# Patient Record
Sex: Female | Born: 1980 | Race: White | Hispanic: No | Marital: Married | Smoking: Never smoker
Health system: Southern US, Community
[De-identification: ages and names within clinical notes are randomized; demographics above are authoritative.]

## PROBLEM LIST (undated history)

## (undated) HISTORY — PX: LAPAROSCOPIC SALPINGOOPHERECTOMY: SUR795

---
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
## 1999-03-30 ENCOUNTER — Inpatient Hospital Stay (HOSPITAL_COMMUNITY): Admission: AD | Admit: 1999-03-30 | Discharge: 1999-03-30 | Payer: Self-pay | Admitting: Obstetrics and Gynecology

## 1999-04-08 ENCOUNTER — Inpatient Hospital Stay (HOSPITAL_COMMUNITY): Admission: AD | Admit: 1999-04-08 | Discharge: 1999-04-10 | Payer: Self-pay | Admitting: Obstetrics and Gynecology

## 2000-05-23 ENCOUNTER — Other Ambulatory Visit: Admission: RE | Admit: 2000-05-23 | Discharge: 2000-05-23 | Payer: Self-pay | Admitting: Obstetrics and Gynecology

## 2000-06-25 ENCOUNTER — Inpatient Hospital Stay (HOSPITAL_COMMUNITY): Admission: AD | Admit: 2000-06-25 | Discharge: 2000-06-25 | Payer: Self-pay | Admitting: Obstetrics and Gynecology

## 2000-08-28 ENCOUNTER — Inpatient Hospital Stay (HOSPITAL_COMMUNITY): Admission: AD | Admit: 2000-08-28 | Discharge: 2000-08-28 | Payer: Self-pay | Admitting: Obstetrics and Gynecology

## 2000-11-30 ENCOUNTER — Inpatient Hospital Stay (HOSPITAL_COMMUNITY): Admission: AD | Admit: 2000-11-30 | Discharge: 2000-11-30 | Payer: Self-pay | Admitting: Obstetrics and Gynecology

## 2000-12-07 ENCOUNTER — Inpatient Hospital Stay (HOSPITAL_COMMUNITY): Admission: AD | Admit: 2000-12-07 | Discharge: 2000-12-09 | Payer: Self-pay | Admitting: Obstetrics and Gynecology

## 2001-01-17 ENCOUNTER — Other Ambulatory Visit: Admission: RE | Admit: 2001-01-17 | Discharge: 2001-01-17 | Payer: Self-pay | Admitting: Obstetrics and Gynecology

## 2011-11-17 ENCOUNTER — Emergency Department (HOSPITAL_BASED_OUTPATIENT_CLINIC_OR_DEPARTMENT_OTHER)
Admission: EM | Admit: 2011-11-17 | Discharge: 2011-11-17 | Disposition: A | Discharge status: Home / Self Care | Payer: BC Managed Care – PPO | Attending: Emergency Medicine | Admitting: Emergency Medicine

## 2011-11-17 ENCOUNTER — Emergency Department (INDEPENDENT_AMBULATORY_CARE_PROVIDER_SITE_OTHER): Payer: BC Managed Care – PPO

## 2011-11-17 DIAGNOSIS — R079 Chest pain, unspecified: Secondary | ICD-10-CM | POA: Insufficient documentation

## 2011-11-17 DIAGNOSIS — R05 Cough: Secondary | ICD-10-CM | POA: Insufficient documentation

## 2011-11-17 DIAGNOSIS — R0789 Other chest pain: Secondary | ICD-10-CM

## 2011-11-17 DIAGNOSIS — R071 Chest pain on breathing: Secondary | ICD-10-CM | POA: Insufficient documentation

## 2011-11-17 DIAGNOSIS — R059 Cough, unspecified: Secondary | ICD-10-CM | POA: Insufficient documentation

## 2011-11-17 LAB — CBC
HCT: 44.1 % (ref 36.0–46.0)
Hemoglobin: 14.4 g/dL (ref 12.0–15.0)
MCH: 27.9 pg (ref 26.0–34.0)
MCHC: 32.7 g/dL (ref 30.0–36.0)
MCV: 85.3 fL (ref 78.0–100.0)
Platelets: 314 10*3/uL (ref 150–400)
RBC: 5.17 MIL/uL — ABNORMAL HIGH (ref 3.87–5.11)
RDW: 13.4 % (ref 11.5–15.5)
WBC: 9.4 10*3/uL (ref 4.0–10.5)

## 2011-11-17 LAB — DIFFERENTIAL
Basophils Absolute: 0 10*3/uL (ref 0.0–0.1)
Basophils Relative: 0 % (ref 0–1)
Eosinophils Absolute: 0.2 10*3/uL (ref 0.0–0.7)
Eosinophils Relative: 2 % (ref 0–5)
Lymphocytes Relative: 21 % (ref 12–46)
Lymphs Abs: 2 10*3/uL (ref 0.7–4.0)
Monocytes Absolute: 0.8 10*3/uL (ref 0.1–1.0)
Monocytes Relative: 8 % (ref 3–12)
Neutro Abs: 6.5 10*3/uL (ref 1.7–7.7)
Neutrophils Relative %: 69 % (ref 43–77)

## 2011-11-17 LAB — D-DIMER, QUANTITATIVE: D-Dimer, Quant: 0.42 ug/mL-FEU (ref 0.00–0.48)

## 2011-11-17 MED ORDER — OXYCODONE-ACETAMINOPHEN 5-325 MG PO TABS: 1.0000 | ORAL_TABLET | ORAL | Status: AC | PRN

## 2011-11-17 NOTE — ED Notes (Signed)
Pt reports a 3 week hx of nonproductive cough and states she was coughing last night and left rib area began to hurt.

## 2011-11-17 NOTE — Discharge Instructions (Signed)
Chest Wall Pain Chest wall pain is pain in or around the bones and muscles of your chest. This may occur:   On its own (spontaneously).   After a viral illness such as the flu.   Through injur.   From coughing.   Minor exercise.  It may take up to 6 weeks to get better; longer if you must stay physically active in your work and activities. HOME CARE INSTRUCTIONS   Avoid over-tiring physical activity. Try not to strain or perform activities which cause pain. This would include any activities using chest, belly (abdominal) and side muscles, especially if heavy weights are used.   Use ice on the painful area for 15 to 20 minutes per hour while awake for the first 2 days. Place the ice in a plastic bag and place a towel between the bag of ice and your skin.   Only take over-the-counter or prescription medicines for pain, discomfort, or fever as directed by your caregiver.  SEEK IMMEDIATE MEDICAL CARE IF:   Your pain increases or you are very uncomfortable.   An oral temperature above 102 F (38.9 C)develops.   Your chest pains become worse.   You develop new, unexplained problems (symptoms).   You develop nausea, vomiting, sweating or feel light headed.   You develop a cough which produces phlegm (sputum) or you cough up blood.  MAKE SURE YOU:   Understand these instructions.   Will watch your condition.   Will get help right away if you are not doing well or get worse.  Document Released: 12/04/2005 Document Revised: 06/19/2011 Document Reviewed: 07/22/2008 [facility] Patient Information 2012 [place], [place].Cough, Adult  A cough is a reflex that helps clear your throat and airways. It can help heal the body or may be a reaction to an irritated airway. A cough may only last 2 or 3 weeks (acute) or may last more than 8 weeks (chronic).  CAUSES Acute cough:  Viral or bacterial infections.  Chronic cough:  Infections.   Allergies.   Asthma.   Post-nasal drip.    Smoking.   Heartburn or acid reflux.   Some medicines.   Chronic lung problems (COPD).   Cancer.  SYMPTOMS   Cough.   Fever.   Chest pain.   Increased breathing rate.   High-pitched whistling sound when breathing (wheezing).   Colored mucus that you cough up (sputum).  TREATMENT   A bacterial cough may be treated with antibiotic medicine.   A viral cough must run its course and will not respond to antibiotics.   Your caregiver may recommend other treatments if you have a chronic cough.  HOME CARE INSTRUCTIONS   Only take over-the-counter or prescription medicines for pain, discomfort, or fever as directed by your caregiver. Use cough suppressants only as directed by your caregiver.   Use a cold steam vaporizer or humidifier in your bedroom or home to help loosen secretions.   Sleep in a semi-upright position if your cough is worse at night.   Rest as needed.   Stop smoking if you smoke.  SEEK IMMEDIATE MEDICAL CARE IF:   You have pus in your sputum.   Your cough starts to worsen.   You cannot control your cough with suppressants and are losing sleep.   You begin coughing up blood.   You have difficulty breathing.   You develop pain which is getting worse or is uncontrolled with medicine.   You have a fever.  MAKE SURE YOU:  Understand these instructions.   Will watch your condition.   Will get help right away if you are not doing well or get worse.  Document Released: 06/02/2011 Document Revised: 08/16/2011 Document Reviewed: 06/02/2011 [facility] Patient Information 2012 [place], [place].

## 2011-11-17 NOTE — ED Provider Notes (Signed)
History     CSN: [id number] Arrival date & time: 11/17/2011  7:54 AM   First MD Initiated Contact with Patient 11/17/11 [phone]      Chief Complaint  Patient presents with  . Cough  . Chest Pain    (Consider location/radiation/quality/duration/timing/severity/associated sxs/prior treatment) HPI Patient with cough for 3 weeks. Began with uri symptoms and low grade fever.  Other symptoms resolved but continues with cough.  Tried mucinex without relief.  Two days ago with coughing had sharp pain in left lower rib cage which continues, worse with coughing sharp, but dull all the time under left breast to around under left shoulder blade.  Nonproductive, no dyspnea.  Patient is not smoker.   History reviewed. No pertinent past medical history.  History reviewed. No pertinent past surgical history.  No family history on file.  History  Substance Use Topics  . Smoking status: Never Smoker   . Smokeless tobacco: Never Used  . Alcohol Use: No    OB History    Grav Para Term Preterm Abortions TAB SAB Ect Mult Living                  Review of Systems  All other systems reviewed and are negative.    Allergies  Review of patient's allergies indicates no known allergies.  Home Medications  No current outpatient prescriptions on file.  BP 155/89  Pulse 113  Temp(Src) 98.9 F (37.2 C) (Oral)  Resp 16  Ht 5\' 8"  (1.727 m)  Wt 220 lb (99.791 kg)  BMI 33.45 kg/m2  SpO2 99%  LMP 10/27/2011  Physical Exam  Nursing note and vitals reviewed. Constitutional: She appears well-developed and well-nourished.  HENT:  Head: Normocephalic and atraumatic.  Right Ear: External ear normal.  Left Ear: External ear normal.    ED Course  Procedures (including critical care time)  Labs Reviewed - No data to display No results found.   No diagnosis found.    MDM   Date: 11/17/2011  Rate: 99  Rhythm: normal sinus rhythm  QRS Axis: normal  Intervals: normal  ST/T Wave  abnormalities: nonspecific ST changes  Conduction Disutrbances:none  Narrative Interpretation:   Old EKG Reviewed: none available        Results for orders placed during the hospital encounter of 11/17/11  CBC      Component Value Range   WBC 9.4  4.0 - 10.5 (K/uL)   RBC 5.17 (*) 3.87 - 5.11 (MIL/uL)   Hemoglobin 14.4  12.0 - 15.0 (g/dL)   HCT 56.2  13.0 - 86.5 (%)   MCV 85.3  78.0 - 100.0 (fL)   MCH 27.9  26.0 - 34.0 (pg)   MCHC 32.7  30.0 - 36.0 (g/dL)   RDW 78.4  69.6 - 29.5 (%)   Platelets 314  150 - 400 (K/uL)  DIFFERENTIAL      Component Value Range   Neutrophils Relative 69  43 - 77 (%)   Neutro Abs 6.5  1.7 - 7.7 (K/uL)   Lymphocytes Relative 21  12 - 46 (%)   Lymphs Abs 2.0  0.7 - 4.0 (K/uL)   Monocytes Relative 8  3 - 12 (%)   Monocytes Absolute 0.8  0.1 - 1.0 (K/uL)   Eosinophils Relative 2  0 - 5 (%)   Eosinophils Absolute 0.2  0.0 - 0.7 (K/uL)   Basophils Relative 0  0 - 1 (%)   Basophils Absolute 0.0  0.0 - 0.1 (K/uL)  D-DIMER,  QUANTITATIVE      Component Value Range   D-Dimer, Quant 0.42  0.00 - 0.48 (ug/mL-FEU)   No information on file.  Dg Chest 2 View  11/17/2011  *RADIOLOGY REPORT*  Clinical Data: Cough and chest pain.  Left-sided rib pain from coughing.  CHEST - 2 VIEW  Comparison: None available.  Findings: The heart size is normal.  The lungs are clear.  There is mild leftward curvature of the upper thoracic spine.  The bones are otherwise unremarkable.  IMPRESSION:  1.  No acute cardiopulmonary disease. 2.  Mild curvature of the upper thoracic spine.  Original Report Authenticated By: Jamesetta Orleans. MATTERN, M.D.    Cough without evidence of acute infiltrate or pneumothorax.  Patient with worsened chest wall pain since cough yesterday.  Plan pain medicine to for pain and cough.    Hilario Quarry, MD 11/17/11 [phone]

## 2011-11-21 ENCOUNTER — Emergency Department (HOSPITAL_BASED_OUTPATIENT_CLINIC_OR_DEPARTMENT_OTHER)
Admission: EM | Admit: 2011-11-21 | Discharge: 2011-11-21 | Disposition: A | Discharge status: Home / Self Care | Payer: BC Managed Care – PPO | Attending: Emergency Medicine | Admitting: Emergency Medicine

## 2011-11-21 ENCOUNTER — Encounter (HOSPITAL_BASED_OUTPATIENT_CLINIC_OR_DEPARTMENT_OTHER): Payer: Self-pay

## 2011-11-21 DIAGNOSIS — R091 Pleurisy: Secondary | ICD-10-CM | POA: Insufficient documentation

## 2011-11-21 DIAGNOSIS — M549 Dorsalgia, unspecified: Secondary | ICD-10-CM | POA: Insufficient documentation

## 2011-11-21 DIAGNOSIS — J40 Bronchitis, not specified as acute or chronic: Secondary | ICD-10-CM | POA: Insufficient documentation

## 2011-11-21 MED ORDER — PREDNISONE 10 MG PO TABS
60.0000 mg | ORAL_TABLET | Freq: Once | ORAL | Status: AC
  Administered 2011-11-21: 60 mg via ORAL
  Filled 2011-11-21: qty 1

## 2011-11-21 MED ORDER — BENZONATATE 100 MG PO CAPS: 100.0000 mg | ORAL_CAPSULE | Freq: Three times a day (TID) | ORAL | Status: AC

## 2011-11-21 MED ORDER — PREDNISONE 50 MG PO TABS: 50.0000 mg | ORAL_TABLET | Freq: Every day | ORAL | Status: AC

## 2011-11-21 MED ORDER — OXYCODONE-ACETAMINOPHEN 5-325 MG PO TABS: 1.0000 | ORAL_TABLET | ORAL | Status: AC | PRN

## 2011-11-21 NOTE — ED Notes (Signed)
Pt reports rib and back pain that started 3 weeks ago and worsening.  She was seen last week and given Percocet.  She states Percocet relieved her pain but she only has one more pill.

## 2011-11-21 NOTE — ED Provider Notes (Signed)
History     CSN: [id number] Arrival date & time: 11/21/2011  2:50 PM   First MD Initiated Contact with Patient 11/21/11 1526      Chief Complaint  Patient presents with  . Back Pain  . Cough    (Consider location/radiation/quality/duration/timing/severity/associated sxs/prior treatment) HPI Comments: Patient presents with 3 weeks of cough without fevers.  She's not sure breath.  She notes that she has pain related to her cough that is in her left mid back and radiates around underneath her breast.  This is the same location the pain has been for the last 3 weeks.  Patient has no associated nausea, vomiting, abdominal pain.  She notes she was here a few days ago and did have a chest x-ray performed and was placed on Percocet which does help with her pain.  Patient does not have a primary care physician which is why she comes back to the emergency department today for reevaluation.  She does state that her cough is improving and her symptoms seem to be getting better she just still has persistent pain.  Patient is a 30 y.o. female presenting with back pain and cough. The history is provided by the patient.  Back Pain  This is a recurrent problem. The current episode started more than 1 week ago. The problem occurs daily. The problem has been gradually improving. The pain is associated with no known injury. The pain is moderate. Associated symptoms include chest pain. Pertinent negatives include no fever, no numbness, no weight loss, no headaches, no abdominal pain, no abdominal swelling, no bowel incontinence, no perianal numbness, no bladder incontinence, no dysuria, no pelvic pain, no leg pain, no paresthesias, no paresis, no tingling and no weakness. She has tried analgesics and NSAIDs for the symptoms.  Cough Associated symptoms include chest pain. Pertinent negatives include no chills, no weight loss, no headaches, no shortness of breath and no eye redness.    History reviewed. No  pertinent past medical history.  History reviewed. No pertinent past surgical history.  No family history on file.  History  Substance Use Topics  . Smoking status: Never Smoker   . Smokeless tobacco: Never Used  . Alcohol Use: No    OB History    Grav Para Term Preterm Abortions TAB SAB Ect Mult Living                  Review of Systems  Constitutional: Negative.  Negative for fever, chills and weight loss.  HENT: Negative.   Eyes: Negative.  Negative for discharge and redness.  Respiratory: Positive for cough. Negative for shortness of breath.   Cardiovascular: Positive for chest pain.  Gastrointestinal: Negative for nausea, vomiting, abdominal pain, diarrhea and bowel incontinence.  Genitourinary: Negative.  Negative for bladder incontinence, dysuria, vaginal discharge and pelvic pain.  Musculoskeletal: Positive for back pain.  Skin: Negative.  Negative for color change and rash.  Neurological: Negative.  Negative for tingling, syncope, weakness, numbness, headaches and paresthesias.  Hematological: Negative.  Negative for adenopathy.  Psychiatric/Behavioral: Negative.  Negative for confusion.  All other systems reviewed and are negative.    Allergies  Review of patient's allergies indicates no known allergies.  Home Medications   Current Outpatient Rx  Name Route Sig Dispense Refill  . OXYCODONE-ACETAMINOPHEN 5-325 MG PO TABS Oral Take 1 tablet by mouth every 4 (four) hours as needed for pain. 10 tablet 0    BP 140/96  Pulse 102  Temp(Src) 98.6 F (37 C) (  Oral)  Resp 16  Ht 5\' 8"  (1.727 m)  Wt 220 lb (99.791 kg)  BMI 33.45 kg/m2  SpO2 100%  LMP 10/27/2011  Physical Exam  Constitutional: She is oriented to person, place, and time. She appears well-developed and well-nourished.  Non-toxic appearance. She does not have a sickly appearance.  HENT:  Head: Normocephalic and atraumatic.  Eyes: Conjunctivae, EOM and lids are normal. Pupils are equal, round,  and reactive to light. No scleral icterus.  Neck: Trachea normal and normal range of motion. Neck supple.  Cardiovascular: Normal rate, regular rhythm and normal heart sounds.   Pulmonary/Chest: Effort normal and breath sounds normal. No respiratory distress. She has no wheezes. She has no rales. She exhibits no tenderness.  Abdominal: Soft. Normal appearance. There is no tenderness. There is no rebound, no guarding and no CVA tenderness.  Musculoskeletal: Normal range of motion.  Neurological: She is alert and oriented to person, place, and time. She has normal strength.  Skin: Skin is warm, dry and intact. No rash noted.  Psychiatric: She has a normal mood and affect. Her behavior is normal. Judgment and thought content normal.    ED Course  Procedures (including critical care time)  Labs Reviewed - No data to display No results found.   No diagnosis found.    MDM  Patient with persistent cough and left-sided chest pain worse with cough.  Patient notes it's been going on for the last 3 weeks.  She was seen here 5 days ago and did have a chest x-ray performed which was normal.  Patient is not short of breath. patient appears to have more pleuritic pain without specific pneumonia.  Clinically she still does not have pneumonia and I do not believe needs a repeat chest x-ray today.  We will give her some steroids to see if this calms down the inflammation and also treat her with Jerilynn Som for her cough.  I also counseled her regarding the importance of a primary care physician.        Nat Christen, MD 11/21/11 [phone]

## 2018-10-31 ENCOUNTER — Encounter: Payer: Self-pay | Admitting: Sports Medicine

## 2018-10-31 ENCOUNTER — Ambulatory Visit (INDEPENDENT_AMBULATORY_CARE_PROVIDER_SITE_OTHER): Payer: BC Managed Care – PPO | Admitting: Sports Medicine

## 2018-10-31 DIAGNOSIS — M5431 Sciatica, right side: Secondary | ICD-10-CM

## 2018-10-31 DIAGNOSIS — G43109 Migraine with aura, not intractable, without status migrainosus: Secondary | ICD-10-CM | POA: Diagnosis not present

## 2018-10-31 DIAGNOSIS — M51369 Other intervertebral disc degeneration, lumbar region without mention of lumbar back pain or lower extremity pain: Secondary | ICD-10-CM | POA: Insufficient documentation

## 2018-10-31 DIAGNOSIS — Z0000 Encounter for general adult medical examination without abnormal findings: Secondary | ICD-10-CM | POA: Diagnosis not present

## 2018-10-31 DIAGNOSIS — M5136 Other intervertebral disc degeneration, lumbar region: Secondary | ICD-10-CM | POA: Insufficient documentation

## 2018-10-31 NOTE — Assessment & Plan Note (Addendum)
Large work-up including multiple brain MRIs were normal with the exception of a single T2 hyperintensity which can be seen in migraines. Lumbar puncture with a normal opening pressure and normal tests. She gets episodes of paresthesias in the face sometimes followed by headache. I think she is having migraine equivalents, occasionally acephalgic migraines. On further questioning she gets two debilitating migraines per year, and thus told her neurologist she did not have migraines. She does however have 4-6 headache days per month, unilateral, throbbing. I do not think any further neurologic work-up is needed, we did discuss topiramate for preventive purposes but she is going to simply using ibuprofen as needed.

## 2018-10-31 NOTE — Assessment & Plan Note (Signed)
Declines flu shots. In a year we will do a tetanus shot, she is not sure on when her last Pap smear was. Had recent blood work which was normal.

## 2018-10-31 NOTE — Progress Notes (Signed)
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  Her symptoms consist mostly of occasional fasciculations of her eyelids which I told her were normal.  She also gets occasional migratory paresthesias over her face, arms, sometimes followed by a headache.  She tells me she has no migraines but on more detailed questioning she only has had 2 debilitating migraines in her life but approximately 4-6 headache days per month with mild photophobia, phonophobia, unilateral, throbbing and resolved with an occasional ibuprofen.  I reviewed the past medical history, family history, social history, surgical history, and allergies today and no changes were needed.  Please see the problem list section below in epic for further details.  Past Medical History: No past medical history on file. Past Surgical History: Past Surgical History:  Procedure Laterality Date  . LAPAROSCOPIC SALPINGOOPHERECTOMY     Social History: Social History   Socioeconomic History  . Marital status: Married    Spouse name: Not on file  . Number of children: Not on file  . Years of education: Not on file  . Highest education level: Not on file  Occupational History  . Not on file  Social Needs  . Financial resource strain: Not on file  . Food insecurity:    Worry: Not on file    Inability: Not on file  . Transportation needs:    Medical: Not on file    Non-medical: Not on file  Tobacco Use  . Smoking status: Never Smoker  . Smokeless tobacco: Never Used  Substance and Sexual Activity  . Alcohol use: No  . Drug use: Never  . Sexual activity: Yes  Lifestyle  . Physical activity:    Days per  week: Not on file    Minutes per session: Not on file  . Stress: Not on file  Relationships  . Social connections:    Talks on phone: Not on file    Gets together: Not on file    Attends religious service: Not on file    Active member of club or organization: Not on file    Attends meetings of clubs or organizations: Not on file    Relationship status: Not on file  Other Topics Concern  . Not on file  Social History Narrative  . Not on file   Family History: No family history on file. Allergies: No Known Allergies Medications: See med rec.  Review of Systems: No headache, visual changes, nausea, vomiting, diarrhea, constipation, dizziness, abdominal pain, skin rash, fevers, chills, night sweats, swollen lymph nodes, weight loss, chest pain, body aches, joint swelling, muscle aches, shortness of breath, mood changes, visual or auditory hallucinations.  Objective:    General: Well Developed, well nourished, and in no acute distress.  Neuro: Alert and oriented x3, extra-ocular muscles intact, sensation grossly intact.  Cranial nerves II through XII are intact, motor, sensory, coordinative functions are all intact, no finger dysmetria, no dysdiadochokinesis, negative Romberg sign. HEENT: Normocephalic, atraumatic, pupils equal round reactive to light, neck supple, no masses, no lymphadenopathy, thyroid nonpalpable.  Skin: Warm and dry, no rashes noted.  Cardiac: Regular rate and rhythm, no murmurs rubs or gallops.  Respiratory: Clear to auscultation bilaterally.  Not using accessory muscles, speaking in full sentences.  Abdominal: Soft, nontender, nondistended, positive bowel sounds, no masses, no organomegaly.  Musculoskeletal: Shoulder, elbow, wrist, hip, knee, ankle stable, and with full range of motion.  Impression and Recommendations:    The patient was counselled, risk factors were discussed, anticipatory guidance given.  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 Lumbar puncture with a normal opening pressure and normal tests. She gets episodes of paresthesias in the face sometimes followed by headache. I think she is having migraine equivalents, occasionally acephalgic migraines. On further questioning she gets two debilitating migraines per year, and thus told her neurologist she did not have migraines. She does however have 4-6 headache days per month, unilateral, throbbing. I do not think any further neurologic work-up is needed, we did discuss topiramate for preventive purposes but she is going to simply using ibuprofen as needed.  Sciatica, right side Take an occasional gabapentin and symptoms are gone. No further work-up needed.  Annual physical exam Declines flu shots. In a year we will do a tetanus shot, she is not sure on when her last Pap smear was. Had recent blood work which was normal. ___________________________________________ Gwen Her. Dianah Field, M.D., ABFM., CAQSM. Primary Care and Sports Medicine [facility]  Adjunct Professor of [employer]

## 2018-10-31 NOTE — Assessment & Plan Note (Signed)
Take an occasional gabapentin and symptoms are gone. No further work-up needed.

## 2018-10-31 NOTE — Patient Instructions (Signed)
Migraine Headache A migraine headache is an intense, throbbing pain on one side or both sides of the head. Migraines may also cause other symptoms, such as nausea, vomiting, and sensitivity to light and noise. What are the causes? Doing or taking certain things may also trigger migraines, such as:  Alcohol.  Smoking.  Medicines, such as: ? Medicine used to treat chest pain (nitroglycerine). ? Birth control pills. ? Estrogen pills. ? Certain blood pressure medicines.  Aged cheeses, chocolate, or caffeine.  Foods or drinks that contain nitrates, glutamate, aspartame, or tyramine.  Physical activity.  Other things that may trigger a migraine include:  Menstruation.  Pregnancy.  Hunger.  Stress, lack of sleep, too much sleep, or fatigue.  Weather changes.  What increases the risk? The following factors may make you more likely to experience migraine headaches:  Age. Risk increases with age.  Family history of migraine headaches.  Being Caucasian.  Depression and anxiety.  Obesity.  Being a woman.  Having a hole in the heart (patent foramen ovale) or other heart problems.  What are the signs or symptoms? The main symptom of this condition is pulsating or throbbing pain. Pain may:  Happen in any area of the head, such as on one side or both sides.  Interfere with daily activities.  Get worse with physical activity.  Get worse with exposure to bright lights or loud noises.  Other symptoms may include:  Nausea.  Vomiting.  Dizziness.  General sensitivity to bright lights, loud noises, or smells.  Before you get a migraine, you may get warning signs that a migraine is developing (aura). An aura may include:  Seeing flashing lights or having blind spots.  Seeing bright spots, halos, or zigzag lines.  Having tunnel vision or blurred vision.  Having numbness or a tingling feeling.  Having trouble talking.  Having muscle weakness.  How is this  diagnosed? A migraine headache can be diagnosed based on:  Your symptoms.  A physical exam.  Tests, such as CT scan or MRI of the head. These imaging tests can help rule out other causes of headaches.  Taking fluid from the spine (lumbar puncture) and analyzing it (cerebrospinal fluid analysis, or CSF analysis).  How is this treated? A migraine headache is usually treated with medicines that:  Relieve pain.  Relieve nausea.  Prevent migraines from coming back.  Treatment may also include:  Acupuncture.  Lifestyle changes like avoiding foods that trigger migraines.  Follow these instructions at home: Medicines  Take over-the-counter and prescription medicines only as told by your health care provider.  Do not drive or use heavy machinery while taking prescription pain medicine.  To prevent or treat constipation while you are taking prescription pain medicine, your health care provider may recommend that you: ? Drink enough fluid to keep your urine clear or pale yellow. ? Take over-the-counter or prescription medicines. ? Eat foods that are high in fiber, such as fresh fruits and vegetables, whole grains, and beans. ? Limit foods that are high in fat and processed sugars, such as fried and sweet foods. Lifestyle  Avoid alcohol use.  Do not use any products that contain nicotine or tobacco, such as cigarettes and e-cigarettes. If you need help quitting, ask your health care provider.  Get at least 8 hours of sleep every night.  Limit your stress. General instructions   Keep a journal to find out what may trigger your migraine headaches. For example, write down: ? What you eat and   drink. ? How much sleep you get. ? Any change to your diet or medicines.  If you have a migraine: ? Avoid things that make your symptoms worse, such as bright lights. ? It may help to lie down in a dark, quiet room. ? Do not drive or use heavy machinery. ? Ask your health care provider  what activities are safe for you while you are experiencing symptoms.  Keep all follow-up visits as told by your health care provider. This is important. Contact a health care provider if:  You develop symptoms that are different or more severe than your usual migraine symptoms. Get help right away if:  Your migraine becomes severe.  You have a fever.  You have a stiff neck.  You have vision loss.  Your muscles feel weak or like you cannot control them.  You start to lose your balance often.  You develop trouble walking.  You faint. This information is not intended to replace advice given to you by your health care provider. Make sure you discuss any questions you have with your health care provider. Document Released: 12/04/2005 Document Revised: 06/23/2016 Document Reviewed: 05/22/2016 [employer]vier Interactive Patient Education  2017 [employer]   

## 2018-12-26 ENCOUNTER — Ambulatory Visit (INDEPENDENT_AMBULATORY_CARE_PROVIDER_SITE_OTHER): Payer: BC Managed Care – PPO | Admitting: Sports Medicine

## 2018-12-26 ENCOUNTER — Ambulatory Visit (INDEPENDENT_AMBULATORY_CARE_PROVIDER_SITE_OTHER): Payer: BC Managed Care – PPO

## 2018-12-26 VITALS — BP 133/91 | HR 102 | Ht 68.0 in | Wt 256.0 lb

## 2018-12-26 DIAGNOSIS — M5412 Radiculopathy, cervical region: Secondary | ICD-10-CM

## 2018-12-26 DIAGNOSIS — Z0000 Encounter for general adult medical examination without abnormal findings: Secondary | ICD-10-CM

## 2018-12-26 DIAGNOSIS — M5431 Sciatica, right side: Secondary | ICD-10-CM

## 2018-12-26 DIAGNOSIS — M542 Cervicalgia: Secondary | ICD-10-CM | POA: Diagnosis not present

## 2018-12-26 DIAGNOSIS — E669 Obesity, unspecified: Secondary | ICD-10-CM

## 2018-12-26 DIAGNOSIS — M545 Low back pain: Secondary | ICD-10-CM | POA: Diagnosis not present

## 2018-12-26 DIAGNOSIS — R635 Abnormal weight gain: Secondary | ICD-10-CM | POA: Insufficient documentation

## 2018-12-26 MED ORDER — PHENTERMINE HCL 37.5 MG PO TABS: ORAL_TABLET | ORAL | 0 refills | Status: DC

## 2018-12-26 MED ORDER — POLYETHYLENE GLYCOL 3350 17 G PO PACK: 17.0000 g | PACK | Freq: Two times a day (BID) | ORAL | 11 refills | Status: AC

## 2018-12-26 MED ORDER — PREDNISONE 50 MG PO TABS: ORAL_TABLET | ORAL | 0 refills | Status: DC

## 2018-12-26 NOTE — Progress Notes (Signed)
Subjective:    CC: Annual physical  HPI:  This is a pleasant 38 year old female here for her physical, she does have some complaints.  As for her physical she is due for HIV testing, Pap smear is up-to-date.  Neck pain: Present for many months, axial, worse with flexion, right periscapular radiating pain, left-sided weakness.  No trauma, constitutional symptoms.  Low back pain: With occasional radiculitis, historically well controlled over the past few months with gabapentin.  Now with worsening symptoms in spite of physician directed conservative measures.  Obesity: Would like to start weight loss treatment  I reviewed the past medical history, family history, social history, surgical history, and allergies today and no changes were needed.  Please see the problem list section below in epic for further details.  Past Medical History: No past medical history on file. Past Surgical History: Past Surgical History:  Procedure Laterality Date  . LAPAROSCOPIC SALPINGOOPHERECTOMY     Social History: Social History   Socioeconomic History  . Marital status: Married    Spouse name: Not on file  . Number of children: Not on file  . Years of education: Not on file  . Highest education level: Not on file  Occupational History  . Not on file  Social Needs  . Financial resource strain: Not on file  . Food insecurity:    Worry: Not on file    Inability: Not on file  . Transportation needs:    Medical: Not on file    Non-medical: Not on file  Tobacco Use  . Smoking status: Never Smoker  . Smokeless tobacco: Never Used  Substance and Sexual Activity  . Alcohol use: No  . Drug use: Never  . Sexual activity: Yes  Lifestyle  . Physical activity:    Days per week: Not on file    Minutes per session: Not on file  . Stress: Not on file  Relationships  . Social connections:    Talks on phone: Not on file    Gets together: Not on file    Attends religious service: Not on file     Active member of club or organization: Not on file    Attends meetings of clubs or organizations: Not on file    Relationship status: Not on file  Other Topics Concern  . Not on file  Social History Narrative  . Not on file   Family History: No family history on file. Allergies: No Known Allergies Medications: See med rec.  Review of Systems: No headache, visual changes, nausea, vomiting, diarrhea, constipation, dizziness, abdominal pain, skin rash, fevers, chills, night sweats, swollen lymph nodes, weight loss, chest pain, body aches, joint swelling, muscle aches, shortness of breath, mood changes, visual or auditory hallucinations.  Objective:    General: Well Developed, well nourished, and in no acute distress.  Neuro: Alert and oriented x3, extra-ocular muscles intact, sensation grossly intact. Cranial nerves II through XII are intact, motor, sensory, and coordinative functions are all intact. HEENT: Normocephalic, atraumatic, pupils equal round reactive to light, neck supple, no masses, no lymphadenopathy, thyroid nonpalpable. Oropharynx, nasopharynx, external ear canals are unremarkable. Skin: Warm and dry, no rashes noted.  Cardiac: Regular rate and rhythm, no murmurs rubs or gallops.  Respiratory: Clear to auscultation bilaterally. Not using accessory muscles, speaking in full sentences.  Abdominal: Soft, nontender, nondistended, positive bowel sounds, no masses, no organomegaly.  Neck: Negative spurling's Full neck range of motion Grip strength and sensation normal in bilateral hands Strength good C4  to T1 distribution No sensory change to C4 to T1 Reflexes normal Back Exam:  Inspection: Unremarkable  Motion: Flexion 45 deg, Extension 45 deg, Side Bending to 45 deg bilaterally,  Rotation to 45 deg bilaterally  SLR laying: Negative  XSLR laying: Negative  Palpable tenderness: None. FABER: negative. Sensory change: Gross sensation intact to all lumbar and sacral  dermatomes.  Reflexes: 2+ at both patellar tendons, 2+ at achilles tendons, Babinski's downgoing.  Strength at foot  Plantar-flexion: 5/5 Dorsi-flexion: 5/5 Eversion: 5/5 Inversion: 5/5  Leg strength  Quad: 5/5 Hamstring: 5/5 Hip flexor: 5/5 Hip abductors: 5/5  Gait unremarkable.  Impression and Recommendations:    The patient was counselled, risk factors were discussed, anticipatory guidance given.  Annual physical exam Annual physical as above. Checking routine labs.  Sciatica, right side Chronic low back pain, occasional right-sided sciatica. Historically better with gabapentin but worsening. Present for years now without improvement. X-ray, MRI for interventional planning.  Radiculitis of right cervical region Right periscapular radicular symptoms, left arm progressive weakness. 5 days of prednisone, x-rays, C-spine MRI.   Obesity Starting phentermine, return monthly for weight checks and refills. She does get frequent constipation when she has used this in the past, adding MiraLAX per her request. ___________________________________________ Gwen Her. Dianah Field, M.D., ABFM., CAQSM. Primary Care and Sports Medicine [facility]  Adjunct Professor of [place] [employer] [facility] of Medicine

## 2018-12-26 NOTE — Assessment & Plan Note (Signed)
Annual physical as above. Checking routine labs.  

## 2018-12-26 NOTE — Assessment & Plan Note (Signed)
Chronic low back pain, occasional right-sided sciatica. Historically better with gabapentin but worsening. Present for years now without improvement. X-ray, MRI for interventional planning.

## 2018-12-26 NOTE — Assessment & Plan Note (Signed)
Right periscapular radicular symptoms, left arm progressive weakness. 5 days of prednisone, x-rays, C-spine MRI.

## 2018-12-26 NOTE — Assessment & Plan Note (Addendum)
Starting phentermine, return monthly for weight checks and refills. She does get frequent constipation when she has used this in the past, adding MiraLAX per her request.

## 2018-12-30 ENCOUNTER — Ambulatory Visit (INDEPENDENT_AMBULATORY_CARE_PROVIDER_SITE_OTHER): Payer: BC Managed Care – PPO

## 2018-12-30 DIAGNOSIS — M5431 Sciatica, right side: Secondary | ICD-10-CM

## 2018-12-30 DIAGNOSIS — M5412 Radiculopathy, cervical region: Secondary | ICD-10-CM

## 2018-12-30 DIAGNOSIS — M50222 Other cervical disc displacement at C5-C6 level: Secondary | ICD-10-CM

## 2019-01-01 ENCOUNTER — Encounter: Payer: Self-pay | Admitting: Sports Medicine

## 2019-01-01 ENCOUNTER — Ambulatory Visit (INDEPENDENT_AMBULATORY_CARE_PROVIDER_SITE_OTHER): Payer: BC Managed Care – PPO | Admitting: Sports Medicine

## 2019-01-01 DIAGNOSIS — M5136 Other intervertebral disc degeneration, lumbar region: Secondary | ICD-10-CM | POA: Diagnosis not present

## 2019-01-01 DIAGNOSIS — M5412 Radiculopathy, cervical region: Secondary | ICD-10-CM

## 2019-01-01 DIAGNOSIS — M51369 Other intervertebral disc degeneration, lumbar region without mention of lumbar back pain or lower extremity pain: Secondary | ICD-10-CM

## 2019-01-01 MED ORDER — MELOXICAM 15 MG PO TABS
ORAL_TABLET | ORAL | 3 refills | Status: AC
Start: 2019-01-01 — End: ?

## 2019-01-01 NOTE — Assessment & Plan Note (Signed)
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 As above, adequately controlled with NSAIDs and an occasional gabapentin. Switching to meloxicam for more convenient dosing.

## 2019-01-01 NOTE — Progress Notes (Signed)
Subjective:    CC: Follow-up  HPI: Neck pain: Cervical DDD on MRI.  Overall well controlled with NSAIDs and an occasional gabapentin.  Back pain: Lumbar DDD on MRI, well controlled with NSAIDs and an occasional gabapentin  I reviewed the past medical history, family history, social history, surgical history, and allergies today and no changes were needed.  Please see the problem list section below in epic for further details.  Past Medical History: No past medical history on file. Past Surgical History: Past Surgical History:  Procedure Laterality Date  . LAPAROSCOPIC SALPINGOOPHERECTOMY     Social History: Social History   Socioeconomic History  . Marital status: Married    Spouse name: Not on file  . Number of children: Not on file  . Years of education: Not on file  . Highest education level: Not on file  Occupational History  . Not on file  Social Needs  . Financial resource strain: Not on file  . Food insecurity:    Worry: Not on file    Inability: Not on file  . Transportation needs:    Medical: Not on file    Non-medical: Not on file  Tobacco Use  . Smoking status: Never Smoker  . Smokeless tobacco: Never Used  Substance and Sexual Activity  . Alcohol use: No  . Drug use: Never  . Sexual activity: Yes  Lifestyle  . Physical activity:    Days per week: Not on file    Minutes per session: Not on file  . Stress: Not on file  Relationships  . Social connections:    Talks on phone: Not on file    Gets together: Not on file    Attends religious service: Not on file    Active member of club or organization: Not on file    Attends meetings of clubs or organizations: Not on file    Relationship status: Not on file  Other Topics Concern  . Not on file  Social History Narrative  . Not on file   Family History: No family history on file. Allergies: No Known Allergies Medications: See med rec.  Review of Systems: No fevers, chills, night sweats, weight  loss, chest pain, or shortness of breath.   Objective:    General: Well Developed, well nourished, and in no acute distress.  Neuro: Alert and oriented x3, extra-ocular muscles intact, sensation grossly intact.  HEENT: Normocephalic, atraumatic, pupils equal round reactive to light, neck supple, no masses, no lymphadenopathy, thyroid nonpalpable.  Skin: Warm and dry, no rashes. Cardiac: Regular rate and rhythm, no murmurs rubs or gallops, no lower extremity edema.  Respiratory: Clear to auscultation bilaterally. Not using accessory muscles, speaking in full sentences.  MRI personally reviewed with the patient.  Impression and Recommendations:    Radiculitis of right cervical region MRI shows a C5-C6 disc protrusion. Only mild central canal stenosis and by foraminal stenosis. Symptoms are overall well controlled with occasional gabapentin and ibuprofen. Switching to meloxicam once daily as an NSAID and she can continue to use gabapentin as needed. Not bad enough yet to consider an epidural.  Lumbar degenerative disc disease L3-L4 disc protrusion with right L3 foraminal stenosis. As above, adequately controlled with NSAIDs and an occasional gabapentin. Switching to meloxicam for more convenient dosing.  I spent 25 minutes with this patient, greater than 50% was face-to-face time counseling regarding the above diagnoses, specifically going over images and teaching her the anatomy and showing her the degenerative processes in her neck  and back. ___________________________________________ Gwen Her. Dianah Field, M.D., ABFM., CAQSM. Primary Care and Sports Medicine [facility]  Adjunct Professor of [place] [employer] [facility] of Medicine

## 2019-01-01 NOTE — Assessment & Plan Note (Signed)
MRI shows a C5-C6 disc protrusion. Only mild central canal stenosis and by foraminal stenosis. Symptoms are overall well controlled with occasional gabapentin and ibuprofen. Switching to meloxicam once daily as an NSAID and she can continue to use gabapentin as needed. Not bad enough yet to consider an epidural.

## 2019-01-02 ENCOUNTER — Ambulatory Visit: Payer: BC Managed Care – PPO | Admitting: Sports Medicine

## 2019-01-02 LAB — LIPID PANEL W/REFLEX DIRECT LDL
Cholesterol: 212 mg/dL — ABNORMAL HIGH (ref <200)
HDL: 45 mg/dL — ABNORMAL LOW (ref >50)
LDL Cholesterol (Calc): 141 mg/dL — ABNORMAL HIGH
Non-HDL Cholesterol (Calc): 167 mg/dL (calc) — ABNORMAL HIGH (ref <130)
Total CHOL/HDL Ratio: 4.7 (calc) (ref <5.0)
Triglycerides: 140 mg/dL (ref <150)

## 2019-01-02 LAB — CBC
HCT: 45.5 % — ABNORMAL HIGH (ref 35.0–45.0)
Hemoglobin: 15.3 g/dL (ref 11.7–15.5)
MCH: 28.5 pg (ref 27.0–33.0)
MCHC: 33.6 g/dL (ref 32.0–36.0)
MCV: 84.9 fL (ref 80.0–100.0)
MPV: 9.4 fL (ref 7.5–12.5)
Platelets: 363 10*3/uL (ref 140–400)
RBC: 5.36 Million/uL — ABNORMAL HIGH (ref 3.80–5.10)
RDW: 13.4 % (ref 11.0–15.0)
WBC: 10.6 Thousand/uL (ref 3.8–10.8)

## 2019-01-02 LAB — COMPREHENSIVE METABOLIC PANEL WITH GFR
AG Ratio: 1.4 (calc) (ref 1.0–2.5)
Albumin: 4.5 g/dL (ref 3.6–5.1)
Alkaline phosphatase (APISO): 71 U/L (ref 33–115)
CO2: 24 mmol/L (ref 20–32)
Calcium: 9.4 mg/dL (ref 8.6–10.2)
Globulin: 3.2 g/dL (ref 1.9–3.7)
Potassium: 4.1 mmol/L (ref 3.5–5.3)
Sodium: 138 mmol/L (ref 135–146)
Total Bilirubin: 0.5 mg/dL (ref 0.2–1.2)
Total Protein: 7.7 g/dL (ref 6.1–8.1)

## 2019-01-02 LAB — COMPREHENSIVE METABOLIC PANEL
ALT: 39 U/L — ABNORMAL HIGH (ref 6–29)
AST: 25 U/L (ref 10–30)
BUN: 16 mg/dL (ref 7–25)
Chloride: 104 mmol/L (ref 98–110)
Creat: 1.02 mg/dL (ref 0.50–1.10)
Glucose, Bld: 82 mg/dL (ref 65–99)

## 2019-01-02 LAB — HIV ANTIBODY (ROUTINE TESTING W REFLEX): HIV 1&2 Ab, 4th Generation: NONREACTIVE

## 2019-01-02 LAB — TSH: TSH: 2.6 m[IU]/L

## 2019-01-23 ENCOUNTER — Ambulatory Visit: Payer: BC Managed Care – PPO | Admitting: Sports Medicine

## 2019-02-21 ENCOUNTER — Ambulatory Visit: Payer: BC Managed Care – PPO | Admitting: Sports Medicine

## 2019-02-21 ENCOUNTER — Encounter: Payer: Self-pay | Admitting: Sports Medicine

## 2019-02-21 DIAGNOSIS — E669 Obesity, unspecified: Secondary | ICD-10-CM | POA: Diagnosis not present

## 2019-02-21 MED ORDER — PHENTERMINE HCL 37.5 MG PO TABS: ORAL_TABLET | ORAL | 0 refills | Status: DC

## 2019-02-21 NOTE — Progress Notes (Signed)
  Subjective:    CC: Weight check  HPI: This is a pleasant 38 year old female, she has lost 12 pounds after the first month on phentermine, she did take a couple of months to get in.  She has been having some constipation, initially okay with some MiraLAX but backed off of the dose.  I reviewed the past medical history, family history, social history, surgical history, and allergies today and no changes were needed.  Please see the problem list section below in epic for further details.  Past Medical History: No past medical history on file. Past Surgical History: Past Surgical History:  Procedure Laterality Date  . LAPAROSCOPIC SALPINGOOPHERECTOMY     Social History: Social History   Socioeconomic History  . Marital status: Married    Spouse name: Not on file  . Number of children: Not on file  . Years of education: Not on file  . Highest education level: Not on file  Occupational History  . Not on file  Social Needs  . Financial resource strain: Not on file  . Food insecurity:    Worry: Not on file    Inability: Not on file  . Transportation needs:    Medical: Not on file    Non-medical: Not on file  Tobacco Use  . Smoking status: Never Smoker  . Smokeless tobacco: Never Used  Substance and Sexual Activity  . Alcohol use: No  . Drug use: Never  . Sexual activity: Yes  Lifestyle  . Physical activity:    Days per week: Not on file    Minutes per session: Not on file  . Stress: Not on file  Relationships  . Social connections:    Talks on phone: Not on file    Gets together: Not on file    Attends religious service: Not on file    Active member of club or organization: Not on file    Attends meetings of clubs or organizations: Not on file    Relationship status: Not on file  Other Topics Concern  . Not on file  Social History Narrative  . Not on file   Family History: No family history on file. Allergies: No Known Allergies Medications: See med  rec.  Review of Systems: No fevers, chills, night sweats, weight loss, chest pain, or shortness of breath.   Objective:    General: Well Developed, well nourished, and in no acute distress.  Neuro: Alert and oriented x3, extra-ocular muscles intact, sensation grossly intact.  HEENT: Normocephalic, atraumatic, pupils equal round reactive to light, neck supple, no masses, no lymphadenopathy, thyroid nonpalpable.  Skin: Warm and dry, no rashes. Cardiac: Regular rate and rhythm, no murmurs rubs or gallops, no lower extremity edema.  Respiratory: Clear to auscultation bilaterally. Not using accessory muscles, speaking in full sentences.  Impression and Recommendations:    Obesity Finished 1 month of phentermine, 12 pound weight loss. She needs to ramp up the MiraLAX up to twice a day again. If persistent constipation we can consider the addition of Senokot-S.    ___________________________________________ Gwen Her. Dianah Field, M.D., ABFM., CAQSM. Primary Care and Sports Medicine [facility]  Adjunct Professor of [place] [employer] [employer] [facility] of Medicine

## 2019-02-21 NOTE — Assessment & Plan Note (Signed)
Finished 1 month of phentermine, 12 pound weight loss. She needs to ramp up the MiraLAX up to twice a day again. If persistent constipation we can consider the addition of Senokot-S.

## 2019-03-17 ENCOUNTER — Encounter: Payer: Self-pay | Admitting: Sports Medicine

## 2019-03-17 DIAGNOSIS — F5101 Primary insomnia: Secondary | ICD-10-CM | POA: Insufficient documentation

## 2019-03-17 MED ORDER — TRAZODONE HCL 50 MG PO TABS: 50.0000 mg | ORAL_TABLET | Freq: Every day | ORAL | 1 refills | Status: DC

## 2019-03-17 NOTE — Assessment & Plan Note (Signed)
start with some elements of sleep hygiene such as avoiding a bright screen before bed, avoiding things that make you the bathroom, and avoiding exercise before bedtime.  In addition we could try starting with low-dose trazodone.  Let me know how this works over the next couple of weeks and we can uptitrate the dose if needed.

## 2019-03-25 ENCOUNTER — Ambulatory Visit (INDEPENDENT_AMBULATORY_CARE_PROVIDER_SITE_OTHER): Payer: BC Managed Care – PPO | Admitting: Sports Medicine

## 2019-03-25 ENCOUNTER — Other Ambulatory Visit: Payer: Self-pay

## 2019-03-25 ENCOUNTER — Encounter: Payer: Self-pay | Admitting: Sports Medicine

## 2019-03-25 DIAGNOSIS — F5101 Primary insomnia: Secondary | ICD-10-CM

## 2019-03-25 DIAGNOSIS — E669 Obesity, unspecified: Secondary | ICD-10-CM

## 2019-03-25 MED ORDER — PHENTERMINE HCL 37.5 MG PO TABS: ORAL_TABLET | ORAL | 0 refills | Status: DC

## 2019-03-25 MED ORDER — TRAZODONE HCL 100 MG PO TABS: 100.0000 mg | ORAL_TABLET | Freq: Every day | ORAL | 3 refills | Status: DC

## 2019-03-25 NOTE — Assessment & Plan Note (Signed)
Entering the third month, additional 9 pound weight loss after 12 pounds the first month. Refilling phentermine. She will get a blood pressure cuff so that we can have a full set of vitals in a month. We can do the next visit as a WebEx or telephone visit. No further complaints of constipation.

## 2019-03-25 NOTE — Progress Notes (Signed)
Virtual Visit via Telephone   I connected with  Kim Bradshaw  on 03/25/19 by telephone and verified that I am speaking with the correct person using two identifiers.   I discussed the limitations, risks, security and privacy concerns of performing an evaluation and management service by telephone, including the higher likelihood of inaccurate diagnosis and treatment, and the availability of in person appointments.  We also discussed the likely need of an additional face to face encounter for complete and high quality delivery of care.  I also discussed with the patient that there may be a patient responsible charge related to this service. The patient expressed understanding and wishes to proceed.  Subjective:    CC: Multiple issues  HPI: Obesity: Good continue weight loss after the second month of phentermine.  Insomnia: No response to 50 mg of trazodone, happy to go up on it.  I reviewed the past medical history, family history, social history, surgical history, and allergies today and no changes were needed.  Please see the problem list section below in epic for further details.  Past Medical History: No past medical history on file. Past Surgical History: Past Surgical History:  Procedure Laterality Date  . LAPAROSCOPIC SALPINGOOPHERECTOMY     Social History: Social History   Socioeconomic History  . Marital status: Married    Spouse name: Not on file  . Number of children: Not on file  . Years of education: Not on file  . Highest education level: Not on file  Occupational History  . Not on file  Social Needs  . Financial resource strain: Not on file  . Food insecurity:    Worry: Not on file    Inability: Not on file  . Transportation needs:    Medical: Not on file    Non-medical: Not on file  Tobacco Use  . Smoking status: Never Smoker  . Smokeless tobacco: Never Used  Substance and Sexual Activity  . Alcohol use: No  . Drug use: Never  . Sexual activity:  Yes  Lifestyle  . Physical activity:    Days per week: Not on file    Minutes per session: Not on file  . Stress: Not on file  Relationships  . Social connections:    Talks on phone: Not on file    Gets together: Not on file    Attends religious service: Not on file    Active member of club or organization: Not on file    Attends meetings of clubs or organizations: Not on file    Relationship status: Not on file  Other Topics Concern  . Not on file  Social History Narrative  . Not on file   Family History: No family history on file. Allergies: No Known Allergies Medications: See med rec.  Review of Systems: No fevers, chills, night sweats, weight loss, chest pain, or shortness of breath.   Objective:    General: Speaking full sentences, no audible heavy breathing.  Sounds alert and appropriately interactive.  No other physical exam performed due to the non-face to face nature of this visit.  Impression and Recommendations:    Obesity Entering the third month, additional 9 pound weight loss after 12 pounds the first month. Refilling phentermine. She will get a blood pressure cuff so that we can have a full set of vitals in a month. We can do the next visit as a WebEx or telephone visit. No further complaints of constipation.  Primary insomnia Going up  to 100 mg of trazodone at bedtime, reevaluate in a month.   I discussed the above assessment and treatment plan with the patient. The patient was provided an opportunity to ask questions and all were answered. The patient agreed with the plan and demonstrated an understanding of the instructions.   The patient was advised to call back or seek an in-person evaluation if the symptoms worsen or if the condition fails to improve as anticipated.   I provided 21 minutes of non-face-to-face time during this encounter, less than 50% of this was time needed to gather information, review chart, records, and complete documentation.    ___________________________________________ Gwen Her. Dianah Field, M.D., ABFM., CAQSM. Primary Care and Sports Medicine [facility]  Adjunct Professor of [place] of [facility] of Medicine

## 2019-03-25 NOTE — Assessment & Plan Note (Signed)
Going up to 100 mg of trazodone at bedtime, reevaluate in a month.

## 2019-03-27 ENCOUNTER — Encounter: Payer: Self-pay | Admitting: Sports Medicine

## 2019-03-31 ENCOUNTER — Encounter: Payer: Self-pay | Admitting: Sports Medicine

## 2019-03-31 ENCOUNTER — Ambulatory Visit (INDEPENDENT_AMBULATORY_CARE_PROVIDER_SITE_OTHER): Payer: BC Managed Care – PPO | Admitting: Sports Medicine

## 2019-03-31 DIAGNOSIS — D171 Benign lipomatous neoplasm of skin and subcutaneous tissue of trunk: Secondary | ICD-10-CM | POA: Diagnosis not present

## 2019-03-31 MED ORDER — DIAZEPAM 10 MG PO TABS: ORAL_TABLET | ORAL | 0 refills | Status: DC

## 2019-03-31 NOTE — Assessment & Plan Note (Signed)
Painful, return in a 30-minute slot on Wednesday for surgical excision. 10 mg of Valium to be taken 2 hours before the procedure.

## 2019-03-31 NOTE — Patient Instructions (Signed)
Lipoma    A lipoma is a noncancerous (benign) tumor that is made up of fat cells. This is a very common type of soft-tissue growth. Lipomas are usually found under the skin (subcutaneous). They may occur in any tissue of the body that contains fat. Common areas for lipomas to appear include the back, shoulders, buttocks, and thighs.   Lipomas grow slowly, and they are usually painless. Most lipomas do not cause problems and do not require treatment.  What are the causes?  The cause of this condition is not known.  What increases the risk?  You are more likely to develop this condition if:   You are 40-60 years old.   You have a family history of lipomas.  What are the signs or symptoms?  A lipoma usually appears as a small, round bump under the skin. In most cases, the lump will:   Feel soft or rubbery.   Not cause pain or other symptoms.  However, if a lipoma is located in an area where it pushes on nerves, it can become painful or cause other symptoms.  How is this diagnosed?  A lipoma can usually be diagnosed with a physical exam. You may also have tests to confirm the diagnosis and to rule out other conditions. Tests may include:   Imaging tests, such as a CT scan or MRI.   Removal of a tissue sample to be looked at under a microscope (biopsy).  How is this treated?  Treatment for this condition depends on the size of the lipoma and whether it is causing any symptoms.   For small lipomas that are not causing problems, no treatment is needed.   If a lipoma is bigger or it causes problems, surgery may be done to remove the lipoma. Lipomas can also be removed to improve appearance. Most often, the procedure is done after applying a medicine that numbs the area (local anesthetic).  Follow these instructions at home:   Watch your lipoma for any changes.   Keep all follow-up visits as told by your health care provider. This is important.  Contact a health care provider if:   Your lipoma becomes larger or  hard.   Your lipoma becomes painful, red, or increasingly swollen. These could be signs of infection or a more serious condition.  Get help right away if:   You develop tingling or numbness in an area near the lipoma. This could indicate that the lipoma is causing nerve damage.  Summary   A lipoma is a noncancerous tumor that is made up of fat cells.   Most lipomas do not cause problems and do not require treatment.   If a lipoma is bigger or it causes problems, surgery may be done to remove the lipoma.  This information is not intended to replace advice given to you by your health care provider. Make sure you discuss any questions you have with your health care provider.  Document Released: 11/24/2002 Document Revised: 11/20/2017 Document Reviewed: 11/20/2017  Elsevier Interactive Patient Education  2019 [employer]

## 2019-03-31 NOTE — Progress Notes (Signed)
  Subjective:    CC: Abdominal mass  HPI: Over the past few weeks Kamaryn has noted a tender mass on her right abdominal wall, symptoms are mild, worsening, localized without radiation.  No GI symptoms, no constitutional symptoms.  I reviewed the past medical history, family history, social history, surgical history, and allergies today and no changes were needed.  Please see the problem list section below in epic for further details.  Past Medical History: No past medical history on file. Past Surgical History: Past Surgical History:  Procedure Laterality Date  . LAPAROSCOPIC SALPINGOOPHERECTOMY     Social History: Social History   Socioeconomic History  . Marital status: Married    Spouse name: Not on file  . Number of children: Not on file  . Years of education: Not on file  . Highest education level: Not on file  Occupational History  . Not on file  Social Needs  . Financial resource strain: Not on file  . Food insecurity:    Worry: Not on file    Inability: Not on file  . Transportation needs:    Medical: Not on file    Non-medical: Not on file  Tobacco Use  . Smoking status: Never Smoker  . Smokeless tobacco: Never Used  Substance and Sexual Activity  . Alcohol use: No  . Drug use: Never  . Sexual activity: Yes  Lifestyle  . Physical activity:    Days per week: Not on file    Minutes per session: Not on file  . Stress: Not on file  Relationships  . Social connections:    Talks on phone: Not on file    Gets together: Not on file    Attends religious service: Not on file    Active member of club or organization: Not on file    Attends meetings of clubs or organizations: Not on file    Relationship status: Not on file  Other Topics Concern  . Not on file  Social History Narrative  . Not on file   Family History: No family history on file. Allergies: No Known Allergies Medications: See med rec.  Review of Systems: No fevers, chills, night sweats,  weight loss, chest pain, or shortness of breath.   Objective:    General: Well Developed, well nourished, and in no acute distress.  Neuro: Alert and oriented x3, extra-ocular muscles intact, sensation grossly intact.  HEENT: Normocephalic, atraumatic, pupils equal round reactive to light, neck supple, no masses, no lymphadenopathy, thyroid nonpalpable.  Skin: Warm and dry, no rashes. Cardiac: Regular rate and rhythm, no murmurs rubs or gallops, no lower extremity edema.  Respiratory: Clear to auscultation bilaterally. Not using accessory muscles, speaking in full sentences. Abdomen: Soft, nontender, nondistended, normal bowel sounds, there is a palpable well-defined, soft, 2 to 3 cm mass in the right sided abdominal wall at the anterior axillary line.  This feels like a lipoma.  It is in fact quite tender to palpation.  Impression and Recommendations:    Lipoma of abdominal wall Painful, return in a 30-minute slot on Wednesday for surgical excision. 10 mg of Valium to be taken 2 hours before the procedure.   ___________________________________________ Gwen Her. Dianah Field, M.D., ABFM., CAQSM. Primary Care and Sports Medicine [facility]  Adjunct Professor of [place] of [facility] of [employer]

## 2019-04-02 ENCOUNTER — Ambulatory Visit (INDEPENDENT_AMBULATORY_CARE_PROVIDER_SITE_OTHER): Payer: BC Managed Care – PPO | Admitting: Sports Medicine

## 2019-04-02 ENCOUNTER — Encounter: Payer: Self-pay | Admitting: Sports Medicine

## 2019-04-02 DIAGNOSIS — D171 Benign lipomatous neoplasm of skin and subcutaneous tissue of trunk: Secondary | ICD-10-CM | POA: Diagnosis not present

## 2019-04-02 MED ORDER — HYDROCODONE-ACETAMINOPHEN 5-325 MG PO TABS: 1.0000 | ORAL_TABLET | Freq: Three times a day (TID) | ORAL | 0 refills | Status: DC | PRN

## 2019-04-02 NOTE — Patient Instructions (Signed)
Incision Care, Adult °An incision is a surgical cut that is made through your skin. Most incisions are closed after surgery. Your incision may be closed with stitches (sutures), staples, skin glue, or adhesive strips. You may need to return to your health care provider to have sutures or staples removed. This may occur several days to several weeks after your surgery. The incision needs to be cared for properly to prevent infection. °How to care for your incision °Incision care ° °· Follow instructions from your health care provider about how to take care of your incision. Make sure you: °? Wash your hands with soap and water before you change the bandage (dressing). If soap and water are not available, use hand sanitizer. °? Change your dressing as told by your health care provider. °? Leave sutures, skin glue, or adhesive strips in place. These skin closures may need to stay in place for 2 weeks or longer. If adhesive strip edges start to loosen and curl up, you may trim the loose edges. Do not remove adhesive strips completely unless your health care provider tells you to do that. °· Check your incision area every day for signs of infection. Check for: °? More redness, swelling, or pain. °? More fluid or blood. °? Warmth. °? Pus or a bad smell. °· Ask your health care provider how to clean the incision. This may include: °? Using mild soap and water. °? Using a clean towel to pat the incision dry after cleaning it. °? Applying a cream or ointment. Do this only as told by your health care provider. °? Covering the incision with a clean dressing. °· Ask your health care provider when you can leave the incision uncovered. °· Do not take baths, swim, or use a hot tub until your health care provider approves. Ask your health care provider if you can take showers. You may only be allowed to take sponge baths for bathing. °Medicines °· If you were prescribed an antibiotic medicine, cream, or ointment, take or apply the  antibiotic as told by your health care provider. Do not stop taking or applying the antibiotic even if your condition improves. °· Take over-the-counter and prescription medicines only as told by your health care provider. °General instructions °· Limit movement around your incision to improve healing. °? Avoid straining, lifting, or exercise for the first month, or for as long as told by your health care provider. °? Follow instructions from your health care provider about returning to your normal activities. °? Ask your health care provider what activities are safe. °· Protect your incision from the sun when you are outside for the first 6 months, or for as long as told by your health care provider. Apply sunscreen around the scar or cover it up. °· Keep all follow-up visits as told by your health care provider. This is important. °Contact a health care provider if: °· Your have more redness, swelling, or pain around the incision. °· You have more fluid or blood coming from the incision. °· Your incision feels warm to the touch. °· You have pus or a bad smell coming from the incision. °· You have a fever or shaking chills. °· You are nauseous or you vomit. °· You are dizzy. °· Your sutures or staples come undone. °Get help right away if: °· You have a red streak coming from your incision. °· Your incision bleeds through the dressing and the bleeding does not stop with gentle pressure. °· The edges of   your incision open up and separate. °· You have severe pain. °· You have a rash. °· You are confused. °· You faint. °· You have trouble breathing and a fast heartbeat. °This information is not intended to replace advice given to you by your health care provider. Make sure you discuss any questions you have with your health care provider. °Document Released: 06/23/2005 Document Revised: 08/11/2016 Document Reviewed: 06/21/2016 °Elsevier Interactive Patient Education © 2019 Elsevier Inc. ° °

## 2019-04-02 NOTE — Progress Notes (Signed)
   Procedure:  Excision of 3.1 cm subcutaneous tumor/lipoma right abdominal wall Risks, benefits, and alternatives explained and consent obtained. Time out conducted. Surface prepped with alcohol. 10cc lidocaine with epinephine infiltrated in a field block. Adequate anesthesia ensured. Area prepped and draped in a sterile fashion. Excision performed with: Using a #10 blade I made a linear incision, then using both sharp and blunt dissection I carried the incision into the subcutaneous tissues.  I then removed a 3.1 cm lipoma in several pieces, I closed the incision with #2 deep dermal 3-0 simple interrupted Vicryl sutures.  I then closed the skin with a running subcuticular 3-0 Vicryl suture. Hemostasis achieved. Pt stable.

## 2019-04-02 NOTE — Assessment & Plan Note (Signed)
Surgical excision. Hydrocodone for postop pain. Return in 2 weeks for a wound check.

## 2019-04-04 ENCOUNTER — Encounter: Payer: Self-pay | Admitting: Sports Medicine

## 2019-04-09 ENCOUNTER — Encounter: Payer: Self-pay | Admitting: Sports Medicine

## 2019-04-09 MED ORDER — DOXYCYCLINE HYCLATE 100 MG PO TABS: 100.0000 mg | ORAL_TABLET | Freq: Two times a day (BID) | ORAL | 0 refills | Status: AC

## 2019-04-16 ENCOUNTER — Ambulatory Visit: Payer: BC Managed Care – PPO | Admitting: Sports Medicine

## 2019-04-16 DIAGNOSIS — D171 Benign lipomatous neoplasm of skin and subcutaneous tissue of trunk: Secondary | ICD-10-CM

## 2019-04-16 NOTE — Progress Notes (Signed)
  Subjective: 2 weeks post lipoma excision, doing well.  Objective: General: Well-developed, well-nourished, and in no acute distress. Incision: Clean, dry, intact.  Assessment/plan:   Lipoma of abdominal wall Incision looks good, we did a course of doxycycline. She will massage the wound daily, return as needed.   ___________________________________________ Gwen Her. Dianah Field, M.D., ABFM., CAQSM. Primary Care and Sports Medicine [facility] [place] [facility]  Adjunct Professor of [place] of [facility] of Medicine

## 2019-04-16 NOTE — Assessment & Plan Note (Signed)
Incision looks good, we did a course of doxycycline. She will massage the wound daily, return as needed.

## 2019-04-18 ENCOUNTER — Encounter: Payer: Self-pay | Admitting: Sports Medicine

## 2019-04-29 ENCOUNTER — Ambulatory Visit (INDEPENDENT_AMBULATORY_CARE_PROVIDER_SITE_OTHER): Payer: BC Managed Care – PPO | Admitting: Sports Medicine

## 2019-04-29 ENCOUNTER — Encounter: Payer: Self-pay | Admitting: Sports Medicine

## 2019-04-29 DIAGNOSIS — E669 Obesity, unspecified: Secondary | ICD-10-CM | POA: Diagnosis not present

## 2019-04-29 DIAGNOSIS — F5101 Primary insomnia: Secondary | ICD-10-CM | POA: Diagnosis not present

## 2019-04-29 MED ORDER — PHENTERMINE HCL 37.5 MG PO TABS: ORAL_TABLET | ORAL | 0 refills | Status: DC

## 2019-04-29 MED ORDER — TRAZODONE HCL 50 MG PO TABS: 75.0000 mg | ORAL_TABLET | Freq: Every day | ORAL | 5 refills | Status: DC

## 2019-04-29 NOTE — Progress Notes (Signed)
Very emotional with Trazodone, wants different sleep medication/reduce dose?

## 2019-04-29 NOTE — Progress Notes (Signed)
Virtual Visit via WebEx/MyChart   I connected with  Kim Bradshaw  on 04/29/19 via WebEx/MyChart/Doximity Video and verified that I am speaking with the correct person using two identifiers.   I discussed the limitations, risks, security and privacy concerns of performing an evaluation and management service by WebEx/MyChart/Doximity Video, including the higher likelihood of inaccurate diagnosis and treatment, and the availability of in person appointments.  We also discussed the likely need of an additional face to face encounter for complete and high quality delivery of care.  I also discussed with the patient that there may be a patient responsible charge related to this service. The patient expressed understanding and wishes to proceed.  Provider location is either at home or medical facility. Patient location is at their home, different from provider location. People involved in care of the patient during this telehealth encounter were myself, my nurse/medical assistant, and my front office/scheduling team member.  Subjective:    CC: Follow-up  HPI: Insomnia: Insufficient relief on 50 mg, to emotional at 100, she would like to drop to 75 mg.  Obesity: Additional 3 pound weight loss after the third month.  I reviewed the past medical history, family history, social history, surgical history, and allergies today and no changes were needed.  Please see the problem list section below in epic for further details.  Past Medical History: No past medical history on file. Past Surgical History: Past Surgical History:  Procedure Laterality Date  . LAPAROSCOPIC SALPINGOOPHERECTOMY     Social History: Social History   Socioeconomic History  . Marital status: Married    Spouse name: Not on file  . Number of children: Not on file  . Years of education: Not on file  . Highest education level: Not on file  Occupational History  . Not on file  Social Needs  . Financial resource  strain: Not on file  . Food insecurity:    Worry: Not on file    Inability: Not on file  . Transportation needs:    Medical: Not on file    Non-medical: Not on file  Tobacco Use  . Smoking status: Never Smoker  . Smokeless tobacco: Never Used  Substance and Sexual Activity  . Alcohol use: No  . Drug use: Never  . Sexual activity: Yes  Lifestyle  . Physical activity:    Days per week: Not on file    Minutes per session: Not on file  . Stress: Not on file  Relationships  . Social connections:    Talks on phone: Not on file    Gets together: Not on file    Attends religious service: Not on file    Active member of club or organization: Not on file    Attends meetings of clubs or organizations: Not on file    Relationship status: Not on file  Other Topics Concern  . Not on file  Social History Narrative  . Not on file   Family History: No family history on file. Allergies: No Known Allergies Medications: See med rec.  Review of Systems: No fevers, chills, night sweats, weight loss, chest pain, or shortness of breath.   Objective:    General: Speaking full sentences, no audible heavy breathing.  Sounds alert and appropriately interactive.  Appears well.  Face symmetric.  Extraocular movements intact.  Pupils equal and round.  No nasal flaring or accessory muscle use visualized.  No other physical exam performed due to the non-physical nature of this  visit.  Impression and Recommendations:    Obesity Entering the fourth month, 12 pounds the first month, 9 pounds the second month, 3 pounds the third month. We will revisit this in 1 month.  Primary insomnia Insufficient symptom relief on 50 mg, too emotional and 100, we are going to drop her to 75 mg, she will do 1-1/2 50 mg tablets.  I discussed the above assessment and treatment plan with the patient. The patient was provided an opportunity to ask questions and all were answered. The patient agreed with the plan and  demonstrated an understanding of the instructions.   The patient was advised to call back or seek an in-person evaluation if the symptoms worsen or if the condition fails to improve as anticipated.   I provided 25 minutes of non-face-to-face time during this encounter, 15 minutes of additional time was needed to gather information, review chart, records, communicate/coordinate with staff remotely, troubleshooting the multiple errors that we get every time when trying to do video calls through the electronic medical record, WebEx, and Doximity, restart the encounter multiple times due to instability of the software, as well as complete documentation.   ___________________________________________ Gwen Her. Dianah Field, M.D., ABFM., CAQSM. Primary Care and Sports Medicine [place] [facility]  Adjunct Professor of [place] of [facility] of Medicine

## 2019-04-29 NOTE — Assessment & Plan Note (Addendum)
Insufficient symptom relief on 50 mg, too emotional and 100, we are going to drop her to 75 mg, she will do 1-1/2 50 mg tablets.

## 2019-04-29 NOTE — Assessment & Plan Note (Signed)
Entering the fourth month, 12 pounds the first month, 9 pounds the second month, 3 pounds the third month. We will revisit this in 1 month.

## 2019-06-19 ENCOUNTER — Ambulatory Visit (INDEPENDENT_AMBULATORY_CARE_PROVIDER_SITE_OTHER): Payer: BC Managed Care – PPO | Admitting: Sports Medicine

## 2019-06-19 ENCOUNTER — Encounter: Payer: Self-pay | Admitting: Sports Medicine

## 2019-06-19 DIAGNOSIS — F5101 Primary insomnia: Secondary | ICD-10-CM | POA: Diagnosis not present

## 2019-06-19 DIAGNOSIS — E669 Obesity, unspecified: Secondary | ICD-10-CM | POA: Diagnosis not present

## 2019-06-19 MED ORDER — TRAZODONE HCL 50 MG PO TABS: ORAL_TABLET | ORAL | 3 refills | Status: AC

## 2019-06-19 MED ORDER — PHENTERMINE HCL 37.5 MG PO TABS: ORAL_TABLET | ORAL | 0 refills | Status: DC

## 2019-06-19 NOTE — Assessment & Plan Note (Signed)
Doing very well with trazodone, she alternates between 75 and 100 mg at bedtime, refilling this with a 65-month supply.

## 2019-06-19 NOTE — Progress Notes (Signed)
Virtual Visit via WebEx/MyChart   I connected with  Kim Bradshaw  on 06/19/19 via WebEx/MyChart/Doximity Video and verified that I am speaking with the correct person using two identifiers.   I discussed the limitations, risks, security and privacy concerns of performing an evaluation and management service by WebEx/MyChart/Doximity Video, including the higher likelihood of inaccurate diagnosis and treatment, and the availability of in person appointments.  We also discussed the likely need of an additional face to face encounter for complete and high quality delivery of care.  I also discussed with the patient that there may be a patient responsible charge related to this service. The patient expressed understanding and wishes to proceed.  Provider location is either at home or medical facility. Patient location is at their home, different from provider location. People involved in care of the patient during this telehealth encounter were myself, my nurse/medical assistant, and my front office/scheduling team member.  Subjective:    CC: Weight check  HPI: Obesity: 1 pound weight loss over the last month.  Insomnia: Doing well with trazodone 75 to 100 mg nightly.  Needs a refill.  I reviewed the past medical history, family history, social history, surgical history, and allergies today and no changes were needed.  Please see the problem list section below in epic for further details.  Past Medical History: No past medical history on file. Past Surgical History: Past Surgical History:  Procedure Laterality Date  . LAPAROSCOPIC SALPINGOOPHERECTOMY     Social History: Social History   Socioeconomic History  . Marital status: Married    Spouse name: Not on file  . Number of children: Not on file  . Years of education: Not on file  . Highest education level: Not on file  Occupational History  . Not on file  Social Needs  . Financial resource strain: Not on file  . Food  insecurity    Worry: Not on file    Inability: Not on file  . Transportation needs    Medical: Not on file    Non-medical: Not on file  Tobacco Use  . Smoking status: Never Smoker  . Smokeless tobacco: Never Used  Substance and Sexual Activity  . Alcohol use: No  . Drug use: Never  . Sexual activity: Yes  Lifestyle  . Physical activity    Days per week: Not on file    Minutes per session: Not on file  . Stress: Not on file  Relationships  . Social Herbalist on phone: Not on file    Gets together: Not on file    Attends religious service: Not on file    Active member of club or organization: Not on file    Attends meetings of clubs or organizations: Not on file    Relationship status: Not on file  Other Topics Concern  . Not on file  Social History Narrative  . Not on file   Family History: No family history on file. Allergies: No Known Allergies Medications: See med rec.  Review of Systems: No fevers, chills, night sweats, weight loss, chest pain, or shortness of breath.   Objective:    General: Speaking full sentences, no audible heavy breathing.  Sounds alert and appropriately interactive.  Appears well.  Face symmetric.  Extraocular movements intact.  Pupils equal and round.  No nasal flaring or accessory muscle use visualized.  No other physical exam performed due to the non-physical nature of this visit.  Impression and  Recommendations:    Obesity Kim Bradshaw has lost another pound. She understands we will be doing 2 more months and then tapering her down. Overall she is had a fantastic weight loss. We do expect some waning of efficacy at this point.  Primary insomnia Doing very well with trazodone, she alternates between 75 and 100 mg at bedtime, refilling this with a 48-month supply.   I discussed the above assessment and treatment plan with the patient. The patient was provided an opportunity to ask questions and all were answered. The patient agreed  with the plan and demonstrated an understanding of the instructions.   The patient was advised to call back or seek an in-person evaluation if the symptoms worsen or if the condition fails to improve as anticipated.   I provided 25 minutes of non-face-to-face time during this encounter, 15 minutes of additional time was needed to gather information, review chart, records, communicate/coordinate with staff remotely, troubleshooting the multiple errors that we get every time when trying to do video calls through the electronic medical record, WebEx, and Doximity, restart the encounter multiple times due to instability of the software, as well as complete documentation.   ___________________________________________ Gwen Her. Dianah Field, M.D., ABFM., CAQSM. Primary Care and Sports Medicine [place] [facility]  Adjunct Professor of [place] of [facility] of Medicine

## 2019-06-19 NOTE — Assessment & Plan Note (Signed)
Kim Bradshaw has lost another pound. She understands we will be doing 2 more months and then tapering her down. Overall she is had a fantastic weight loss. We do expect some waning of efficacy at this point.

## 2019-08-01 ENCOUNTER — Encounter: Payer: Self-pay | Admitting: Sports Medicine

## 2019-08-01 ENCOUNTER — Telehealth (INDEPENDENT_AMBULATORY_CARE_PROVIDER_SITE_OTHER): Payer: BC Managed Care – PPO | Admitting: Sports Medicine

## 2019-08-01 VITALS — BP 112/64 | HR 77 | Ht 68.0 in | Wt 226.0 lb

## 2019-08-01 DIAGNOSIS — R635 Abnormal weight gain: Secondary | ICD-10-CM

## 2019-08-01 MED ORDER — PHENTERMINE HCL 37.5 MG PO TABS: ORAL_TABLET | ORAL | 0 refills | Status: AC

## 2019-08-01 NOTE — Progress Notes (Signed)
Virtual Visit via WebEx/MyChart   I connected with  GAVYN ZOSS  on 08/01/19 via WebEx/MyChart/Doximity Video and verified that I am speaking with the correct person using two identifiers.   I discussed the limitations, risks, security and privacy concerns of performing an evaluation and management service by WebEx/MyChart/Doximity Video, including the higher likelihood of inaccurate diagnosis and treatment, and the availability of in person appointments.  We also discussed the likely need of an additional face to face encounter for complete and high quality delivery of care.  I also discussed with the patient that there may be a patient responsible charge related to this service. The patient expressed understanding and wishes to proceed.  Provider location is either at home or medical facility. Patient location is at their home, different from provider location. People involved in care of the patient during this telehealth encounter were myself, my nurse/medical assistant, and my front office/scheduling team member.  Subjective:    CC: Weight check  HPI: See assessment and plan for further details  I reviewed the past medical history, family history, social history, surgical history, and allergies today and no changes were needed.  Please see the problem list section below in epic for further details.  Past Medical History: No past medical history on file. Past Surgical History: Past Surgical History:  Procedure Laterality Date  . LAPAROSCOPIC SALPINGOOPHERECTOMY     Social History: Social History   Socioeconomic History  . Marital status: Married    Spouse name: Not on file  . Number of children: Not on file  . Years of education: Not on file  . Highest education level: Not on file  Occupational History  . Not on file  Social Needs  . Financial resource strain: Not on file  . Food insecurity    Worry: Not on file    Inability: Not on file  . Transportation needs    Medical: Not on file    Non-medical: Not on file  Tobacco Use  . Smoking status: Never Smoker  . Smokeless tobacco: Never Used  Substance and Sexual Activity  . Alcohol use: No  . Drug use: Never  . Sexual activity: Yes  Lifestyle  . Physical activity    Days per week: Not on file    Minutes per session: Not on file  . Stress: Not on file  Relationships  . Social Herbalist on phone: Not on file    Gets together: Not on file    Attends religious service: Not on file    Active member of club or organization: Not on file    Attends meetings of clubs or organizations: Not on file    Relationship status: Not on file  Other Topics Concern  . Not on file  Social History Narrative  . Not on file   Family History: No family history on file. Allergies: No Known Allergies Medications: See med rec.  Review of Systems: No fevers, chills, night sweats, weight loss, chest pain, or shortness of breath.   Objective:    General: Speaking full sentences, no audible heavy breathing.  Sounds alert and appropriately interactive.  Appears well.  Face symmetric.  Extraocular movements intact.  Pupils equal and round.  No nasal flaring or accessory muscle use visualized.  No other physical exam performed due to the non-physical nature of this visit.  Impression and Recommendations:    Obesity Good continued weight loss, 30 pounds now. We are entering the last month  of phentermine at which point we will do a month of intermittent fasting for 16 hours followed by long-term intermittent fasting for 18 hours. She will also add 30 minutes of brisk walking per day with her daughters. Follow-up with me for an additional virtual visit in 1 month.  I discussed the above assessment and treatment plan with the patient. The patient was provided an opportunity to ask questions and all were answered. The patient agreed with the plan and demonstrated an understanding of the instructions.   The  patient was advised to call back or seek an in-person evaluation if the symptoms worsen or if the condition fails to improve as anticipated.   I provided 25 minutes of non-face-to-face time during this encounter, 15 minutes of additional time was needed to gather information, review chart, records, communicate/coordinate with staff remotely, troubleshooting the multiple errors that we get every time when trying to do video calls through the electronic medical record, WebEx, and Doximity, restart the encounter multiple times due to instability of the software, as well as complete documentation.   ___________________________________________ Gwen Her. Dianah Field, M.D., ABFM., CAQSM. Primary Care and Sports Medicine [place] [facility]  Adjunct Professor of [place] of [facility] of Medicine

## 2019-08-01 NOTE — Assessment & Plan Note (Signed)
Good continued weight loss, 30 pounds now. We are entering the last month of phentermine at which point we will do a month of intermittent fasting for 16 hours followed by long-term intermittent fasting for 18 hours. She will also add 30 minutes of brisk walking per day with her daughters. Follow-up with me for an additional virtual visit in 1 month.

## 2019-08-27 ENCOUNTER — Other Ambulatory Visit: Payer: Self-pay

## 2019-08-27 DIAGNOSIS — Z20822 Contact with and (suspected) exposure to covid-19: Secondary | ICD-10-CM

## 2019-08-28 LAB — NOVEL CORONAVIRUS, NAA: SARS-CoV-2, NAA: NOT DETECTED

## 2019-09-02 ENCOUNTER — Encounter: Payer: Self-pay | Admitting: Sports Medicine

## 2019-09-02 DIAGNOSIS — U071 COVID-19: Secondary | ICD-10-CM | POA: Insufficient documentation

## 2019-09-08 ENCOUNTER — Encounter: Payer: Self-pay | Admitting: Sports Medicine

## 2019-09-08 MED ORDER — DEXAMETHASONE 4 MG PO TABS: 4.0000 mg | ORAL_TABLET | Freq: Three times a day (TID) | ORAL | 0 refills | Status: AC

## 2019-09-09 ENCOUNTER — Ambulatory Visit: Payer: BC Managed Care – PPO | Admitting: Sports Medicine

## 2019-09-09 ENCOUNTER — Encounter: Payer: Self-pay | Admitting: Sports Medicine

## 2019-09-09 DIAGNOSIS — U071 COVID-19: Secondary | ICD-10-CM

## 2019-09-12 MED ORDER — BUDESONIDE-FORMOTEROL FUMARATE 160-4.5 MCG/ACT IN AERO: 1.0000 | INHALATION_SPRAY | Freq: Two times a day (BID) | RESPIRATORY_TRACT | 3 refills | Status: AC

## 2019-09-12 NOTE — Addendum Note (Signed)
Addended by: Silverio Decamp on: 09/12/2019 01:56 PM   Modules accepted: Orders

## 2019-09-12 NOTE — Assessment & Plan Note (Signed)
Persistent burning with deep breathing, she is post COVID.  Decadron was not helpful, adding Symbicort to be used twice a day.

## 2019-09-16 IMAGING — MR MR CERVICAL SPINE W/O CM
5 series · 34 of 48 positions shown · non-contrast
Comparison: Cervical spine x-rays dated December 26, 2018.

CLINICAL DATA: Chronic neck and upper back pain with bilateral arm
pain and weakness.

EXAM:
MRI CERVICAL SPINE WITHOUT CONTRAST
TECHNIQUE: Multiplanar, multisequence MR imaging of the cervical spine was
performed. No intravenous contrast was administered.

[Series 2: T2 · sagittal · 3.0mm · 0.56mm/px · 7 of 13 slices shown (1 of 2)]
[im 1/13]
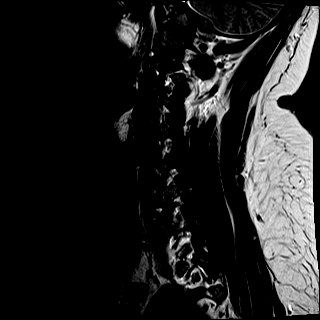
[im 3/13]
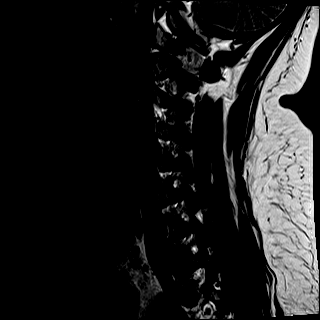
[im 5/13]
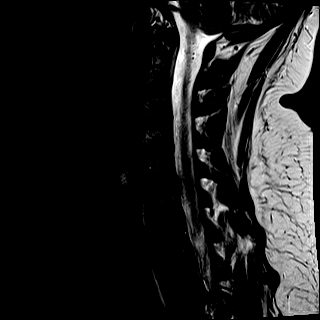
[im 7/13]
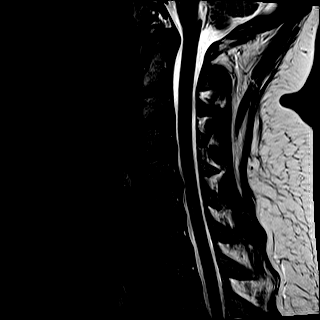
[im 9/13]
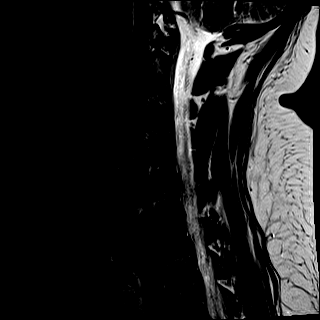
[im 11/13]
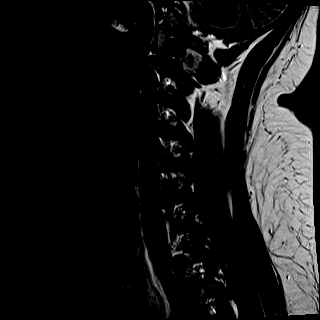
[im 13/13]
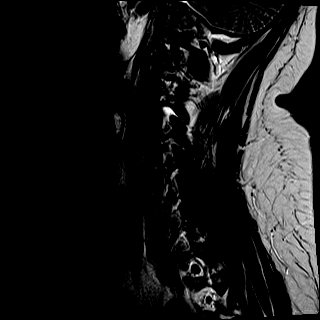

[Series 3: T1 · sagittal · 3.0mm · 0.70mm/px · 7 of 13 slices shown]
[im 1/13]
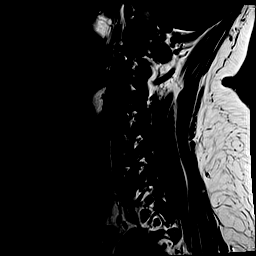
[im 3/13]
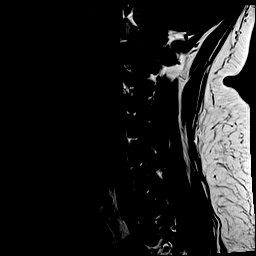
[im 5/13]
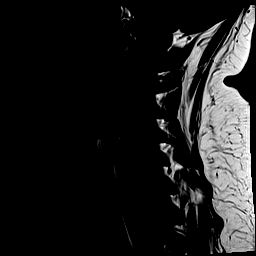
[im 7/13]
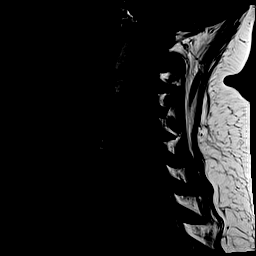
[im 9/13]
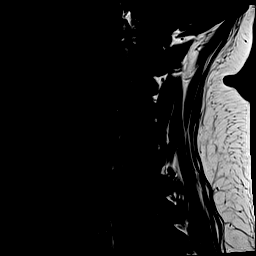
[im 11/13]
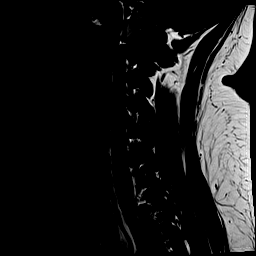
[im 13/13]
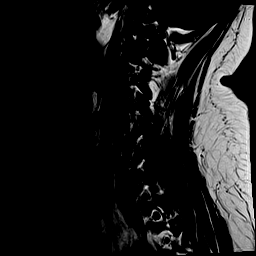

[Series 4: STIR · sagittal · 3.0mm · 0.35mm/px · 6 of 13 slices shown]
[im 1/13]
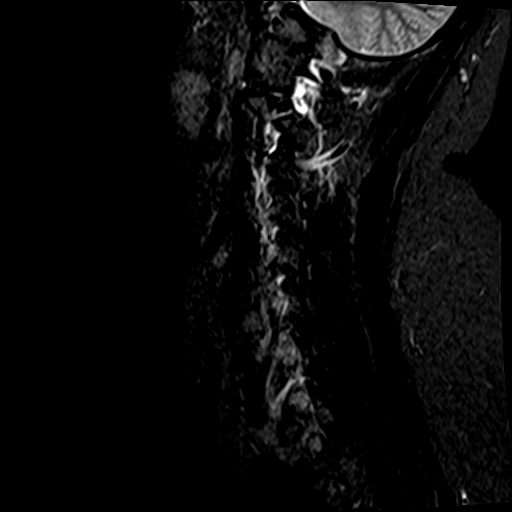
[im 3/13]
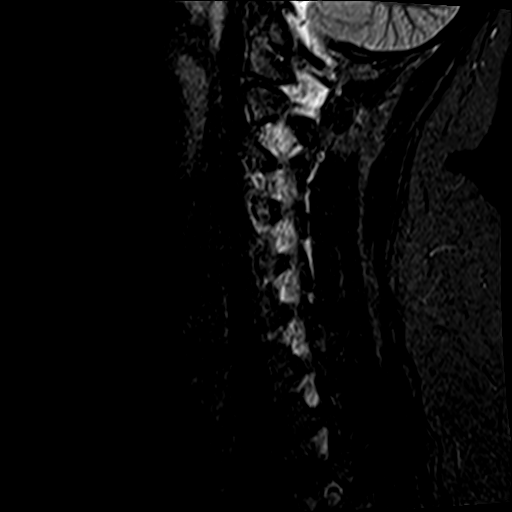
[im 5/13]
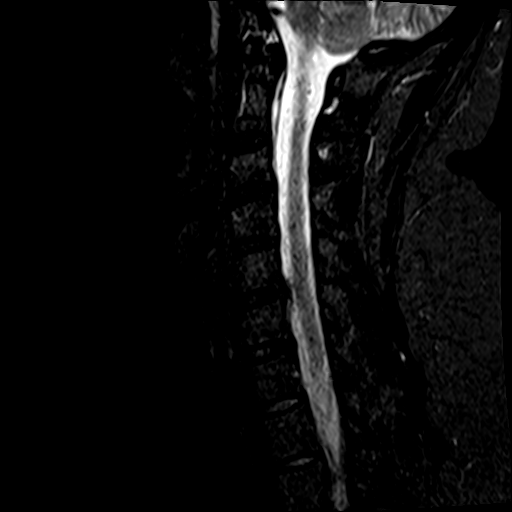
[im 8/13]
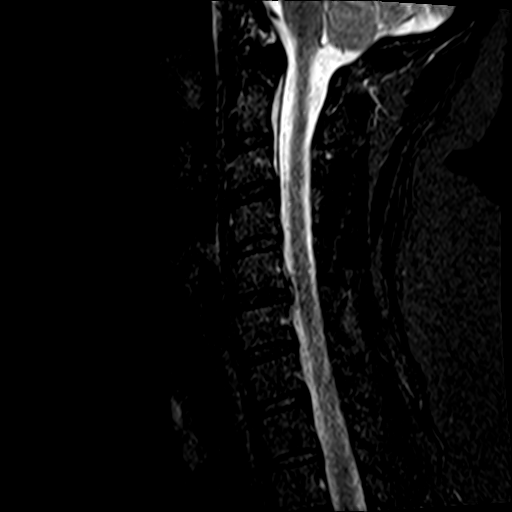
[im 10/13]
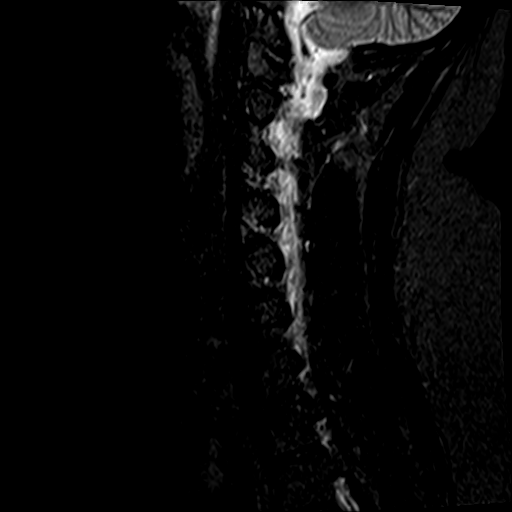
[im 13/13]
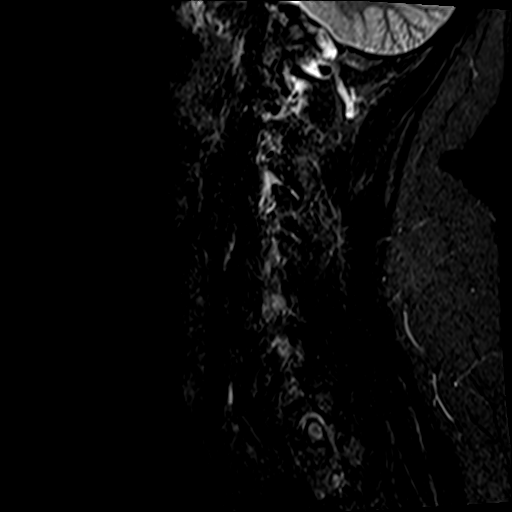

[Series 5: T2 · axial · 3.0mm · 0.62mm/px · z∈[-193,-86]mm · 8 of 29 slices shown (2 of 2)]
[im 1/29]
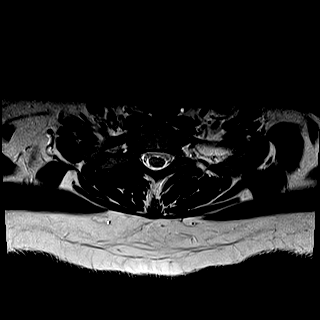
[im 5/29]
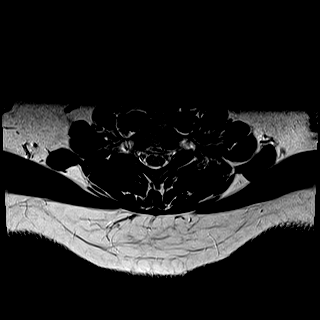
[im 9/29]
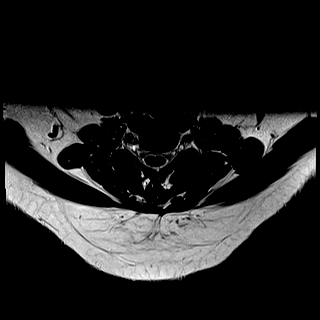
[im 13/29]
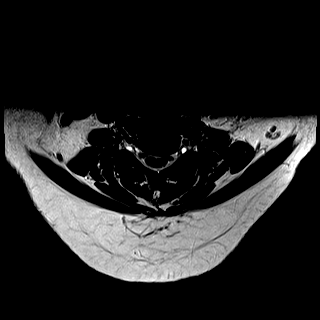
[im 16/29]
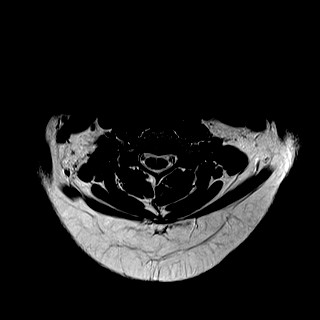
[im 20/29]
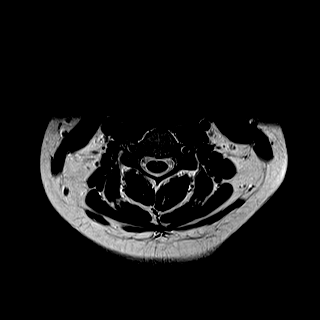
[im 24/29]
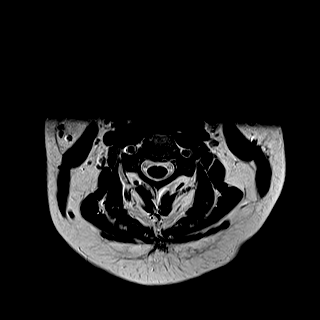
[im 29/29]
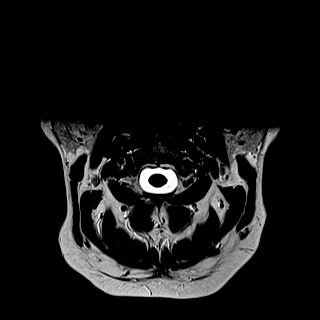

[Series 6: mpgr ax · axial · 3.0mm · 0.39mm/px · z∈[-188,-115]mm · 6 of 29 slices shown]
[im 1/29]
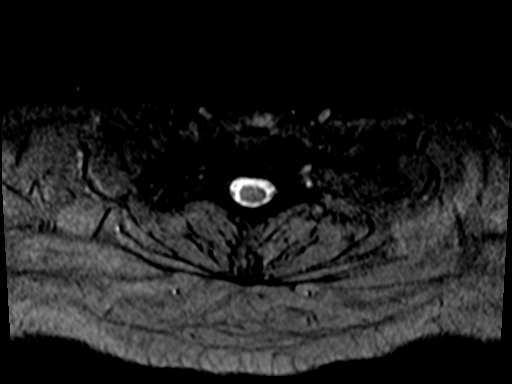
[im 5/29]
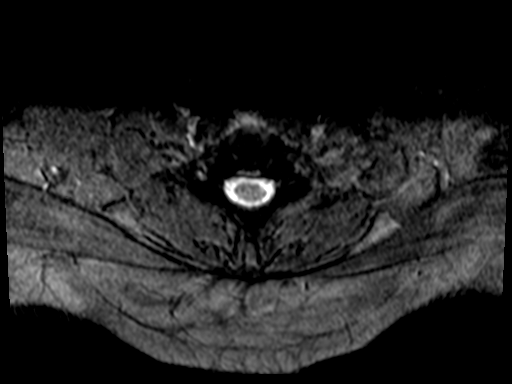
[im 9/29]
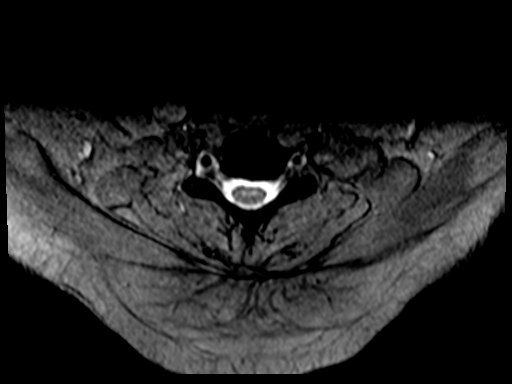
[im 13/29]
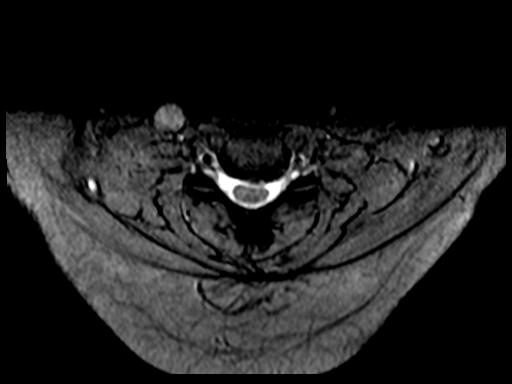
[im 16/29]
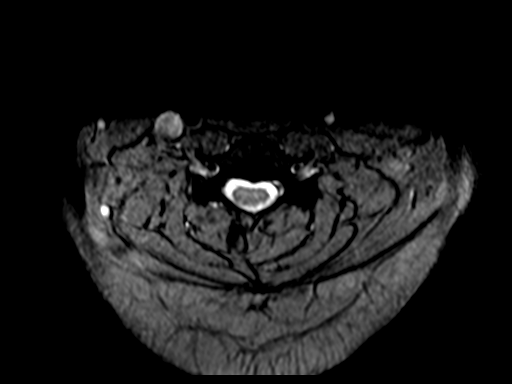
[im 20/29]
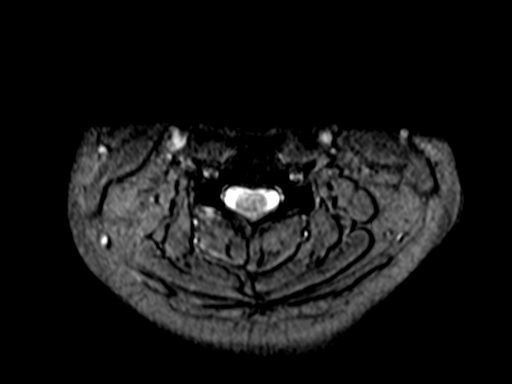

[34 of 48 positions shown; findings below may reference images not displayed]

FINDINGS: Alignment: Straightening of the normal cervical lordosis. Sagittal
alignment is maintained.

Vertebrae: No fracture, evidence of discitis, or bone lesion.

Cord: Normal signal and morphology.

Posterior Fossa, vertebral arteries, paraspinal tissues: Negative.

Disc levels:

C2-C3:  Negative.

C3-C4:  Negative.

C4-C5:  Negative.

C5-C6: Small central and left paracentral disc protrusion abutting
and slightly deforming the central and left ventral cord. Borderline
mild central spinal canal stenosis. No neuroforaminal stenosis.

C6-C7:  Negative.

C7-T1: Left uncovertebral hypertrophy. Moderate left neuroforaminal
stenosis. No spinal canal or right neuroforaminal stenosis.
IMPRESSION: 1. Small central and left paracentral disc protrusion at C5-C6 with
mild mass effect on the spinal cord.
2. Moderate left neuroforaminal stenosis at C7-T1 due to
uncovertebral hypertrophy.

## 2019-10-17 ENCOUNTER — Encounter: Payer: Self-pay | Admitting: Sports Medicine

## 2019-11-26 ENCOUNTER — Encounter: Payer: Self-pay | Admitting: Sports Medicine
